# Patient Record
Sex: Female | Born: 1935 | Race: White | Hispanic: No | Marital: Married | Smoking: Never smoker
Health system: Southern US, Community
[De-identification: ages and names within clinical notes are randomized; demographics above are authoritative.]

## PROBLEM LIST (undated history)

## (undated) DIAGNOSIS — K219 Gastro-esophageal reflux disease without esophagitis: Secondary | ICD-10-CM

## (undated) DIAGNOSIS — I10 Essential (primary) hypertension: Secondary | ICD-10-CM

## (undated) DIAGNOSIS — M1990 Unspecified osteoarthritis, unspecified site: Secondary | ICD-10-CM

## (undated) DIAGNOSIS — N289 Disorder of kidney and ureter, unspecified: Secondary | ICD-10-CM

## (undated) DIAGNOSIS — M109 Gout, unspecified: Secondary | ICD-10-CM

## (undated) DIAGNOSIS — G629 Polyneuropathy, unspecified: Secondary | ICD-10-CM

## (undated) DIAGNOSIS — C801 Malignant (primary) neoplasm, unspecified: Secondary | ICD-10-CM

## (undated) DIAGNOSIS — Z8739 Personal history of other diseases of the musculoskeletal system and connective tissue: Secondary | ICD-10-CM

## (undated) DIAGNOSIS — M549 Dorsalgia, unspecified: Secondary | ICD-10-CM

## (undated) DIAGNOSIS — J189 Pneumonia, unspecified organism: Secondary | ICD-10-CM

## (undated) HISTORY — PX: TONSILLECTOMY: SUR1361

## (undated) HISTORY — PX: TOTAL HIP ARTHROPLASTY: SHX124

## (undated) HISTORY — PX: CATARACT EXTRACTION, BILATERAL: SHX1313

## (undated) HISTORY — PX: OTHER SURGICAL HISTORY: SHX169

## (undated) HISTORY — PX: ABDOMINAL HYSTERECTOMY: SHX81

## (undated) HISTORY — PX: MOHS SURGERY: SUR867

---

## 2007-02-10 ENCOUNTER — Emergency Department (HOSPITAL_COMMUNITY): Admission: EM | Admit: 2007-02-10 | Discharge: 2007-02-10 | Payer: Self-pay | Admitting: Emergency Medicine

## 2007-02-10 ENCOUNTER — Inpatient Hospital Stay (HOSPITAL_COMMUNITY): Admission: RE | Admit: 2007-02-10 | Discharge: 2007-02-18 | Payer: Self-pay | Admitting: Orthopaedic Surgery

## 2007-02-24 ENCOUNTER — Inpatient Hospital Stay (HOSPITAL_COMMUNITY): Admission: AD | Admit: 2007-02-24 | Discharge: 2007-02-25 | Payer: Self-pay | Admitting: Emergency Medicine

## 2008-10-03 ENCOUNTER — Encounter: Admission: RE | Admit: 2008-10-03 | Discharge: 2008-10-03 | Payer: Self-pay | Admitting: Family Medicine

## 2009-04-19 IMAGING — CR DG CHEST 2V
2 series · 2 of 2 positions shown · non-contrast
Comparison: None.

CLINICAL DATA: 79-year-old with osteoarthritis of left hip. Preop for surgery.
 CHEST ? 2 VIEW:

[view not recorded (1 of 2)]
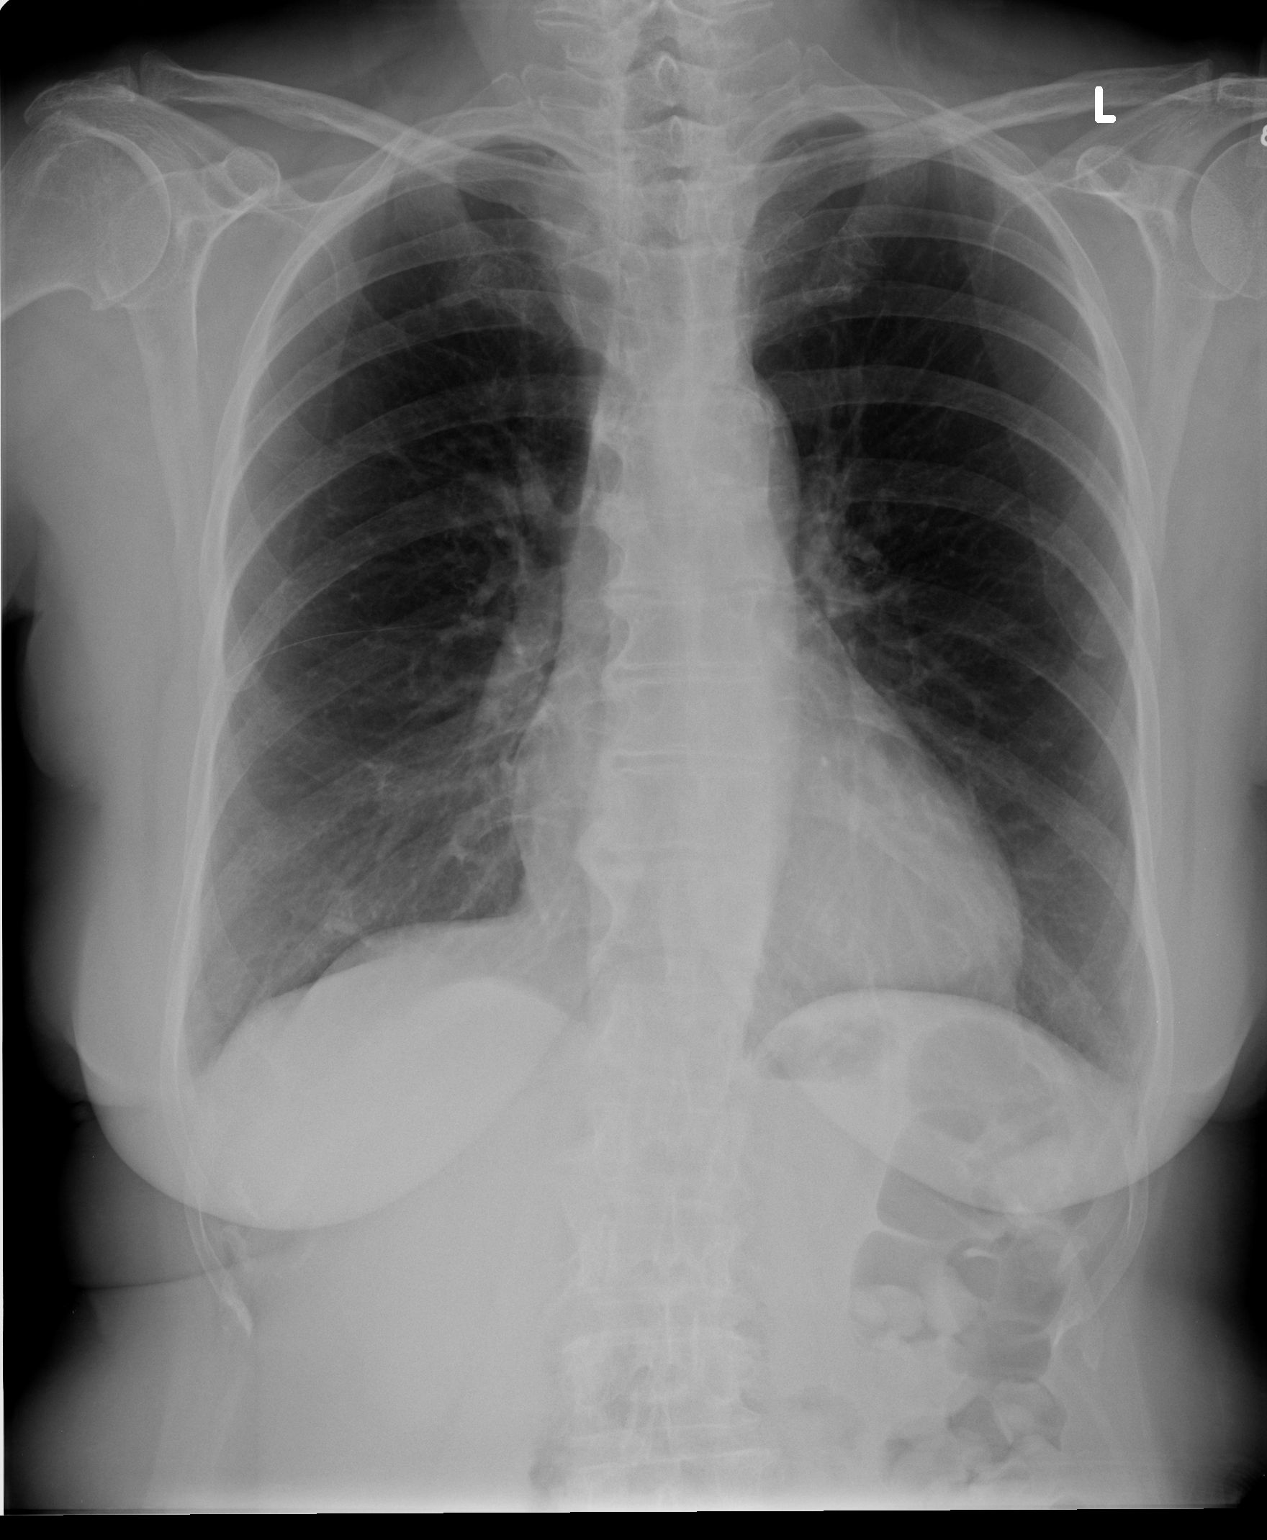

[view not recorded (2 of 2)]
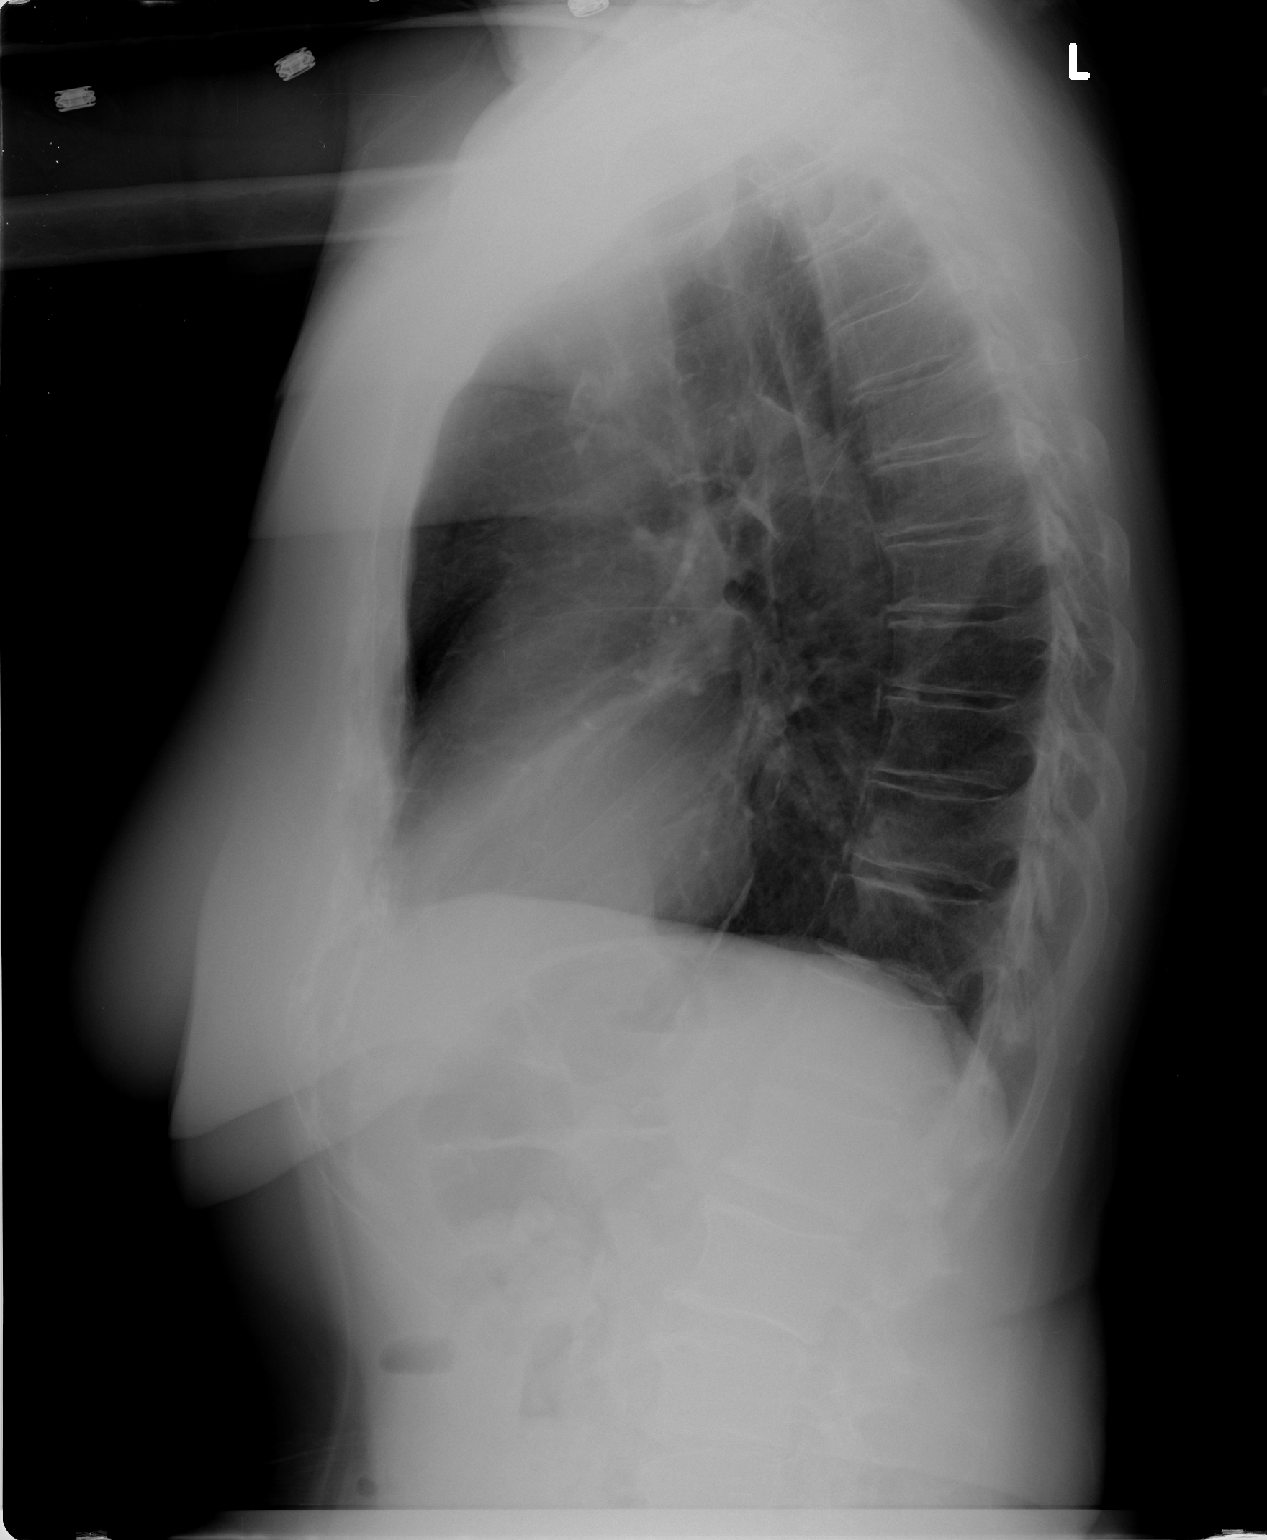

[2 of 2 positions shown; findings below may reference images not displayed]

FINDINGS: Cardiac silhouette, mediastinal and hilar contours are within normal limits.  Mild tortuosity and calcification of the thoracic aorta is noted.  The lungs are clear of acute process.  Bony structures are intact.
IMPRESSION: No acute cardiopulmonary findings.
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
## 2010-06-03 NOTE — Op Note (Signed)
[name], MANTON               ACCOUNT NO.:  [id number]   MEDICAL RECORD NO.:  [id number]          PATIENT TYPE:  INP   LOCATION:  5015                         FACILITY:  [facility]   PHYSICIAN:  Claude Manges. Whitfield, M.D.DATE OF BIRTH:  [birth year]   DATE OF PROCEDURE:  DATE OF DISCHARGE:                               OPERATIVE REPORT   PREOPERATIVE DIAGNOSIS:  Posterior superior dislocation left total hip  replacement.   POSTOPERATIVE DIAGNOSIS:  Posterior superior dislocation left total hip  replacement.   PROCEDURE:  Closed reduction of dislocated left total hip replacement  and application of abduction splint.   SURGEON:  Claude Manges. Cleophas Dunker, M.D.   ASSISTANTDyke Brackett, M.D.   ANESTHESIA:  General orotracheal.   COMPLICATIONS:  None.   PROCEDURE IN DETAIL:  With the patient under general orotracheal  anesthesia she was carefully placed on the operating room table.  Image  intensification was obtained revealing a posterior position of the  bipolar prosthesis.   With the hip in flexion and with traction I was able to reduce the hip.  Preoperatively she was shortened internally rotated.  At that point she  was out to length without malrotation.  She had good pulses.  I then  carefully manipulated her hip and in flexion she was perfectly stable  but with flexion and adduction she was unstable.  Accordingly the  abduction brace was applied and again we checked with image  intensification and she was perfectly stable.  The patient was then  awakened and placed on her bed and returned to the post anesthesia  recovery in satisfactory condition.      Claude Manges. Cleophas Dunker, M.D.  Electronically Signed     PWW/MEDQ  D:  02/11/2007  T:  02/12/2007  Job:  [id number]

## 2010-06-03 NOTE — Discharge Summary (Signed)
[name], [name]               ACCOUNT NO.:  [id number]   MEDICAL RECORD NO.:  [id number]          PATIENT TYPE:  INP   LOCATION:  5015                         FACILITY:  [facility]   PHYSICIAN:  [name], M.D.DATE OF BIRTH:  January 04, 1928   DATE OF ADMISSION:  02/10/2007  DATE OF DISCHARGE:  02/18/2007                               DISCHARGE SUMMARY   ADMISSION DIAGNOSES:  1. End-stage osteoarthritis, left hip.  2. Hypertension.  3. Hyperlipidemia.  4. History of skin cancer.   DISCHARGE DIAGNOSES:  1. End-stage osteoarthritis, left hip, status post left total hip      arthroplasty.  2. Acute dislocation of left total hip after surgery, now status post      closed reduction.  3. Acute blood loss anemia secondary to surgery.  4. Hypotension.  5. Hypokalemia, now resolved.  6. Anxiety/depression.  7. Constipation.  8. Delirium, now resolved.  9. Hypertension.  10.Hyperlipidemia.  11.History of skin cancer.   SURGICAL PROCEDURE:  1. She had a left total hip arthroplasty by Dr. Claude Manges. [name],      assisted by [name] and [name], P.A.-C.  She had a      DePuy Prodigy hip stem with pore coat size 13.5 placed with a      Pinnacle 100 series acetabular cup size 52 and an acetabular cup      size 54 which were not implanted, the same with an Apex hole      eliminator.  She eventually had a self-centering bipolar head, 28      mm inner diameter, 56 mm outer diameter, and an Articul/EZE femoral      head, 28 mm +8.5 neck length, 12/14 comb.  Complications:  Total hip was unable to be placed, and they had to  convert to a bipolar hemiarthroplasty.  1. Closed reduction of her left hip hemiarthroplasty by Dr. Claude Manges.      Knight, assisted by Dr. Lacretia Knight. [name].  Complications:      None.   CONSULTATIONS:  1. Internal medicine consult by [facility] on February 10, 2007.  2. Pharmacy consult for Coumadin therapy on February 10, 2007.  3. Ortho tech  consult for an abduction brace on February 11, 2007.  4. Physical therapy consult on February 12, 2007.  5. Psychiatry consult by Dr. Jeanie Knight on February 14, 2007.  6. Social work consult on February 14, 2007.   HISTORY OF PRESENT ILLNESS:  [name]  presented to Dr. Cleophas Knight with a several month history of left hip  pain.  It has been getting progressively worse.  She lives in [place]  but did not desire surgery in that area.  She saw Dr. Cleophas Knight because  the pain was constant and requiring some pain meds.  X-ray showed a  malformed left femoral head, and because of this, she is presenting for  a left hip arthroplasty.   HOSPITAL COURSE:  Ms. [name] tolerated her surgical procedure well  without immediate postoperative complications.  She was transferred to  5000.  She did have an episode of hypotension.  Fluid challenge was  given, and a medical consult was obtained.  They followed her throughout  her hospitalization.   On postop day #1, T max was 97.7, vitals were stable.  Hemoglobin 9.7,  hematocrit 27.9.  Hypotension had improved.  She was having a fair  amount of anxiety.  She was started on OxyContin for pain, and medicine  started her on some Lexapro.  She had an episode of severe pain with  thrashing about, and new x-rays were ordered of the hip which showed  that the hip was dislocated superiorly.  Subsequently that evening, she  underwent a closed reduction of the hip by Dr. Cleophas Knight.  She tolerated  that well.   Next day, she still had quite a bit of anxiety.  Potassium was low at  3.1, hemoglobin 10.6.  Leg was neurovascularly intact.  She was in the  abduction brace at that time and is to be in that at all times.  Psychiatry was again recommended.  She continued to make slow progress  over the next several days, afebrile, vitals stable.  Leg lengths were  normal length.  She had great difficulty with pain and anxiety about  being moved.   Dr. Jeanie Knight was able to see her on February 14, 2007 and  made some recommendations.   Over the next several days, she made slow progress with therapy.  It was  felt she would require some extra time for rehab, so bed placement for a  skilled facility was started.  Her vitals remained stable.  Hemoglobin/hematocrit normalized, and she was continued on therapy.  At  this time, on February 18, 2007, it is felt she is ready for transfer to  the skilled facility, and she will be transferred there later today.   DIET:  She is to resume her regular hospitalization diet.   DISCHARGE MEDICATIONS:  1. Colace 100 mg p.o. b.i.d.  2. Coumadin 3 mg p.o. at 6 p.m. to keep her INR at about 2.  3. Hydrochlorothiazide 12.5 mg p.o. q.a.m.  4. K-Dur 20 mEq p.o. q.a.m.  5. Lopressor 50 mg p.o. b.i.d.  6. Lotensin 20 mg p.o. q.a.m.  7. Protonix 40 mg p.o. q.a.m.  8. Ambien 5 mg p.o. q.h.s. p.r.n. insomnia.  9. Ativan 1 mg IV q.6h. p.r.n. anxiety.  10.Enema of choice, laxative of choice p.r.n. constipation.  11.Robaxin 500 mg 1-2 tablets p.o. q.6h. p.r.n. for spasms.  12.Tylenol 1-2 tablets p.o. q.4h. p.r.n. temperature or pain.  13.Norco 5/325 1-2 tablets p.o. q.4h. p.r.n. for pain.   ACTIVITY:  She is to be out of bed and partial weightbearing 50% on the  left leg with the use of walker.  She is to have the abduction brace on  at all times.  She is to have PT and OT per rehab protocol.   WOUND CARE:  Left hip incision is to be kept clean and dry.  If Mepilex  is in place, it can remain in place for about 3-5 days and then can be  changed as needed.  Wound can be cleaned with Betadine and apply a dry  dressing.  Please notify Dr. Cleophas Knight of temperature greater than  101.5, chills, pain unrelieved by pain medications, or foul-smelling  drainage from the wound.   FOLLOW UP:  She needs to follow up with Dr. Cleophas Knight in our office next  week and needs to call [phone] for that appointment.  She  needs to  follow up with a psychologist in  the next couple of weeks.  He  recommends if the facility has one that visits, they can follow up with  her for her anxiety.  Otherwise, she can follow up as an outpatient with  Dr. Farrel Demark or Dr. Dellia Cloud of .   LABORATORY DATA:  Hemoglobin and hematocrit have ranged from 9.3 and  27.1 on the 24th to 10.2 and 28.8 on the 30th.  White count went from  9.5 on the 24th to 11.4 on the 25th, to 8.6 on the 30th.  Platelet count  has been within normal limits.   Potassium dropped to a low of 3.1 on the 25th.  Glucose ranged from 105  on the 30th to 154 on the 25th.  PT and INR have gone from 18.7 and 1.5  on the 24th to 29.5 and 2.7 on the 30th.  Calcium dropped to a low of  8.2 on the 25th.  All other laboratory studies are within normal limits.   Chest x-ray done on February 04, 2007 showed no acute cardiopulmonary  findings.  X-ray taken of the left hip on February 11, 2007 showed a  dislocation of that left hip hemiarthroplasty.      Legrand Pitts Duffy, P.A.      Sherry Manges. [name], M.D.  Electronically Signed    KED/MEDQ  D:  02/18/2007  T:  02/18/2007  Job:  [id number]   cc:   Antonietta Breach, M.D.

## 2010-06-03 NOTE — Op Note (Signed)
[name], [name]               ACCOUNT NO.:  [id number]   MEDICAL RECORD NO.:  [id number]          PATIENT TYPE:  EMS   LOCATION:  MAJO                         FACILITY:  [facility]   PHYSICIAN:  Claude Manges. 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   PROCEDURE:  Left total hip replacement (bipolar arthroplasty).   SURGEON:  Claude Manges. Cleophas Dunker, M.D.   ASSISTANT:  Thereasa Distance A. Chaney Malling, M.D. and Arlys John D. Petrarca, P.A.-C.   ANESTHESIA:  General.   COMPLICATIONS:  None.   COMPONENTS:  A 13.5 mm small stature Prodigy femoral stem, 56 mm outer  diameter femoral head with bipolar construct (failed attempt at total  hip replacement with unstable components using a 52 and 54 acetabular  components).   PROCEDURE:  The patient comfortable on operating table and under general  orotracheal anesthesia, nursing staff inserted a Foley catheter.  The  patient was then placed in the lateral decubitus position and carefully  padded.   The left hip and lower extremity were then prepped with Betadine scrub  and DuraPrep from iliac crest to the ankle.  Sterile draping was  performed.   A routine Southern incision was utilized and via sharp dissection  carried down to subcutaneous tissue.  Small bleeders were Bovie  coagulated.  Dissection was carried down through adipose tissue to the  level of the iliotibial band.  Self-retaining retractors were inserted.  Iliotibial band was identified, incised along with the skin incision.  We had some difficulty with internal rotation of the hip because of the  deformity but I was able locate the short external rotators.  These were  tagged with 0-0 Ethibond suture  and then incised in their attachment of  the posterior aspect of the greater trochanter.  The capsule was  identified and carefully incised.  The head was then dislocated easily  posteriorly.  The retractor was then carefully placed about the femoral  neck and head using the calcar guide, an osteotomy was made about a  fingerbreadth proximal to the lesser trochanter.  The head and neck were  then removed from the wound.  The head was malformed.  There appeared to  be areas of avascular necrosis.  There was considerable synovitis within  the joint.  Synovectomy was performed.   We easily prepared the femur for prosthetic insertion.  Starter hole was  then made.  Reaming was performed to 13 to accept a 13.5-mm component.  Rasping was performed sequentially to 13.5 small stature we had a very  nice position on the calcar.  There were no cracks.   Retractor was then placed about the acetabulum.  Further synovectomy and  soft tissue was removed from the periphery including any extraneous  labral material.  There were considerable spurring.  We had measured a  52 mm outer diameter femoral  head preoperatively by templating we reamed  to a 51 and trialed a 52 and felt that we had a very nice rim fit and we  would seat completely.   The 52 mm outer diameter 100 series acetabular component was then  inserted.  Despite impaction we could not make it stable as the patient  appeared to have an expanding acetabulum.  We then trialed a 54 mm outer  diameter and reamed to 53.  We trialed a 54 mm outer diameter 100 series  component and the acetabulum continued to feel as if it were expanding.  We could not get good purchase.  We did identify the sciatic nerve.  We  carefully protected it throughout the operative procedure.  We did have  a nerve stimulator to be sure the nerve was functioning.   At that point we felt that we needed stability and accordingly, we  decided on a bipolar component.  We  used a 56 mm head as we again  trialed with a 56 and felt that it would completely seat within the  acetabulum.  The posterior wall was devoid of some bony material and  that perhaps was part of the problem.  Nonetheless, with the bipolar  component, we had nice stability without posterior dislocation using the  prodigy neck.   We then elected to proceed with the final bipolar components.  The wound  was copiously irrigated with saline solution after removing all trial  components.   The prodigy 13.5 mm femoral component was then impacted flush on the  calcar.  The acetabulum was carefully irrigated.  Any soft tissue was  carefully protected with my finger.  We constructed the bipolar  components with a 56 mm outer diameter head.  We then reduced it and  through a range of motion we could not dislocate the hip posteriorly.   The wound was again irrigated with saline solution.  Again we checked  the nerve stimulator.  The nerve was functioning   I then carefully and anatomically closed the capsule with 0-0 Ethibond  suture.  The tensor fascia lata was then closed with a running 0-0  Vicryl along with the iliotibial band.  The subcu was closed in several  layers of 0-0 and 2-0 Vicryl, skin closed with skin clips.  Sterile  bulky dressing was applied followed by a knee immobilizer.  The patient  was then awoken and returned to the post anesthesia recovery in  satisfactory condition.      Claude Manges. Cleophas Dunker, M.D.  Electronically Signed     PWW/MEDQ  D:  02/10/2007  T:  02/10/2007  Job:  [id number]

## 2010-06-03 NOTE — Op Note (Signed)
NAMEMAGDELENA, KINSELLA               ACCOUNT NO.:  [id number]   MEDICAL RECORD NO.:  [id number]          PATIENT TYPE:  INP   LOCATION:  5038                         FACILITY:  [facility]   PHYSICIAN:  Claude Manges. Whitfield, M.D.DATE OF BIRTH:  25-Dec-1927   DATE OF PROCEDURE:  02/24/2007  DATE OF DISCHARGE:  02/25/2007                               OPERATIVE REPORT   PREOPERATIVE DIAGNOSIS:  Posterior superior dislocation of bipolar  arthroplasty left hip.   POSTOPERATIVE DIAGNOSIS:  Posterior superior dislocation of bipolar  arthroplasty left hip.   PROCEDURE:  Attempted closed reduction.   SURGEON:  Claude Manges. Cleophas Dunker, M.D.   ASSISTANT:  Arlys John D. Petrarca, P.A.-C.   ANESTHESIA:  General orotracheal.   COMPLICATIONS:  Unable to reduce.   PROCEDURE IN DETAIL:  With the patient comfortable in the operating room  on her floor bed she was placed under general anesthesia and then  transferred carefully to the operating room table in her abduction  splint.  The splint was released.  Skin was inspected and her old  incision had healed very nicely.  There was no evidence of any skin  problems other than the fact that it was dry.  Urine had soaked the  brace.   Images revealed a posterior superior position of the bipolar head.  We  attempted in multiple positions with traction to reduce the head and  were unsuccessful.  She was considerably short and abducted.  We were  able to at least bring about out to length and abduct her but could not  place the head in the acetabulum.  We tried flexion and even  hyperflexion and then traction, adduction, abduction and even  longitudinal traction and slight flexion and could not dislodge the  head.  I suspect that the bipolar head is spun and so is perched above  the acetabulum or there is sore tissue i.e. capsule between preventing  complete reduction.   Ms. Lint has an appointment to see Dr. Lamar Sprinkles at [facility] on  Tuesday for consideration  of hip revision and I felt that to open the  hip to reduce the fracture would just potentially compromise future  surgery, increase the risk of infection.  I think we can make her  comfortable in the abduction brace.  We changed the padding and padded  the incision very well and the leg lengths at that point were about  level so we did provide some distal position.  We even fluoro'd the hip  and felt that we just could not quite seat the acetabulum.  The patient  tolerated the procedure without complications and she will be returned  to the post anesthesia recovery room in satisfactory position.     Claude Manges. Cleophas Dunker, M.D.  Electronically Signed    PWW/MEDQ  D:  02/24/2007  T:  02/25/2007  Job:  [id number]

## 2010-06-03 NOTE — Discharge Summary (Signed)
NAMEGLADY, Knight               ACCOUNT NO.:  [id number]   MEDICAL RECORD NO.:  [id number]          PATIENT TYPE:  EMS   LOCATION:  MAJO                         FACILITY:  [facility]   PHYSICIAN:  Claude Manges. Whitfield, M.D.DATE OF BIRTH:  [birth year]   DATE OF ADMISSION:  02/10/2007  DATE OF DISCHARGE:  02/18/2007                               DISCHARGE SUMMARY   ADMISSION DIAGNOSES:  1. End-stage osteoarthritis left hip.  2. Hypertension.  3. Hyperlipidemia.  4. History of skin cancer.   DISCHARGE DIAGNOSES:  1. End-stage osteoarthritis left hip status post left total hip      arthroplasty.  2. Acute dislocation left total hip now status post closed reduction.  3. Anxiety/depression.  4. Acute blood loss anemia secondary to surgery.  5. Transient hypotension now resolved.  6. Acute blood loss anemia secondary to surgery.  7. Hypokalemia now resolved.   1. Hypertension.  2. Hyperlipidemia.  3. History of skin cancer.   SURGICAL PROCEDURES:  1. On February 10, 2007, [name] underwent a left total hip      arthroplasty by Dr. Claude Manges.  Whitfield assisted by Dr. Rinaldo Ratel and Jennette Kettle PA-C.  She had a bipolar hip      hemiarthroplasty placed.  It was an apex hole eliminator and      pedicle 100 series acetabular cup size 52.  A pinnacle sector 2      acetabular cup size 54 was not implanted, but a self centering      bipolar head 28 mm inner diameter 56 mm outer diameter.  A Prodigy      hip stem with Porocoat 32.5 mm stem left with an Articul/EZE      femoral head +8.5 neck length.   1. On February 11, 2007 [name] underwent a closed reduction of her      left hip hemiarthroplasty by Dr. Claude Manges.  Whitfield assisted by      Dr. Lacretia Nicks.  Frederico Hamman.   COMPLICATIONS:  None.   CONSULTANT:  1. Internal Medicine consult by [facility] February 10, 2007.  2. Pharmacy consult for Coumadin therapy February 10, 2007.  3. Physical therapy consult February 11, 2007.  4. [employer] consult for abduction brace.  5. Psychiatry consult February 14, 2007.  6. Case management consult February 14, 2007   HISTORY OF PRESENT ILLNESS:  [name]  presented to Dr. Cleophas Dunker with a several months history of left hip  pain.  Pain has been getting progressively worse and she was diagnosed  with some arthritis.  She did not treatment in [place], [place], so  came down to see Dr. Cleophas Dunker.  Pain is severe enough it is requiring  medications.  X-rays show a malformed left hip, and because of this, she  is presenting for left hip replacement.   HOSPITAL COURSE:  [name] tolerated her surgical procedure well and  she was moved to the recovery room.  She did have some problems with  hypotension postoperatively and was having a fair amount of pain.  A  medical consult was obtained for hypotension and they followed her  throughout her hospitalization.  She had difficulty with pain and  thrashed about when awakening and with those types of movements was  noted to have dislocated her total hip.  [employer] was consulted for an  abduction brace and plans were made for a closed reduction of the hip  the next day.  She was afebrile, vitals stable.  Hemoglobin 9.7,  hematocrit 27.9.  On the evening of January  23, she underwent closed  reduction of the dislocated hip hemiarthroplasty and she tolerated that  well.   On January  24, her hypotension improved but her anxiety was escalating  due to the events from the day before.  She was having a fair amount of  pain was minimal contact.  Potassium was low at 3.1, hemoglobin 10.6.  She was started on therapy per protocol.  Anxiety was treated with  medications at that time.   On January 26, T-max 98.6, vitals stable.  Hemoglobin 10.1, hematocrit  28.8.  She had some mild swelling of the thigh.  Leg lengths seemed to  be equal.  She was to be in the abduction brace at all times.  Psych  consult was  obtained, and they followed her and made recommendations  over the next several days.  It was felt at that time she would require  little additional therapy before going home, so plans were made for  skilled placement.   She continued to make slow progress over the next several days.  She  remained basically afebrile, vitals stable.  INR got up to a therapeutic  level hemoglobin/hematocrit stabilized.  She did not require any more  blood.  Had some difficulty with constipation which was treated with  laxatives.  Her anxiety did improve with medications.  She is voiding  without difficulty, and it is felt she is ready for transfer to the  skilled facility today and will be discharged later today.   MEDICATIONS:  1. Coumadin p.o. q. 6 p.m., dose to be 3 mg p.o. daily to keep her INR      between right about two.  2. Colace 100 mg p.o. b.i.d..  3. Hydrochlorothiazide 12.5 mg p.o. q.a.m.Marland Kitchen  4. Lotensin 20 mg p.o. q.a.m..  5. K-Dur 20 mEq p.o. q.a.m..  6. Protonix 40 mg p.o. q.a.m.Marland Kitchen  7. Metoprolol 50 mg p.o. b.i.d..  8. Ambien 5 mg p.o. q.h.s. p.r.n. insomnia.  9. Laxative of choice p.r.n. constipation.  10.Robaxin 500 mg 1-2 tablets p.o. q.6 h p.r.n. for spasms.  11.Ativan 1 mg IV q.6 h p.r.n. anxiety.  12.Hydrocodone 5/325 1-2 tablets p.o. q.4 h p.r.n. for pain.   DISCHARGE INSTRUCTIONS:  1. Diet:  She is to resume her regular diet.  2. Activity:  She is to be out of bed partial weightbearing 50% or      less on the left leg with the use of a walker.  She is to wear the      abduction brace at all times.  She is to have PT and OT per rehab      protocol.  3. Wound care:  Please clean the left hip incision with Betadine daily      and apply dry dressing.  If she comes to the skilled facility with      a Mepilex in place, that can remain as long as it stays intact for      another 3-5 days.  At that  point, the wound probably can be open to      air.  Please notify Dr. Cleophas Dunker of  temperature greater than      101.5, chills, pain unrelieved by pain medication or foul-smelling      drainage from the wound.  4. Follow-up:  She needs to follow up with Dr. Cleophas Dunker in our office      in approximately one week and needs to call [phone].  She does      need to have some psychotherapy to treat her anxiety and needs a      psychologist, either one for the skilled facility or Dr. Jeanie Sewer      here recommended Dr. Farrel Demark or Dr. Latrelle Dodrill with [facility].  She needs      to follow up with him per the facility.   LABORATORY DATA:  Hemoglobin/hematocrit ranged from 11.4 and 32.7 on  January 22 to 10.1 and 28.8 on January 26.  White count remained within  normal limits.  Platelet count went to a high of 419 on the 30th.   Potassium dropped to a low of 3.1 on January 25 then was within normal  limits.  Glucose ranged from 105 on January 30 to 154 on January 25.  PT  and INR ranged from 18.7 and 1.5 on January 24 to 2.8 and 30.2 on  January  29 to 29.5 and 2.7 on January  30.  Calcium dropped to a low of  8.2 on January 25.  All other laboratory studies were within normal  limits.      Legrand Pitts Duffy, P.A.      Claude Manges. Cleophas Dunker, M.D.  Electronically Signed    KED/MEDQ  D:  02/18/2007  T:  02/18/2007  Job:  [id number]
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
## 2010-10-09 LAB — PROTIME-INR
INR: 1.7 — ABNORMAL HIGH
INR: 2.8 — ABNORMAL HIGH
Prothrombin Time: 14.1
Prothrombin Time: 18.7 — ABNORMAL HIGH
Prothrombin Time: 20.8 — ABNORMAL HIGH

## 2010-10-09 LAB — COMPREHENSIVE METABOLIC PANEL
ALT: 16
ALT: 19
AST: 18
AST: 20
Albumin: 3.8
Albumin: 3.9
Alkaline Phosphatase: 129 — ABNORMAL HIGH
Alkaline Phosphatase: 157 — ABNORMAL HIGH
BUN: 15
CO2: 28
Chloride: 100
Creatinine, Ser: 0.86
GFR calc non Af Amer: >60
Potassium: 3.4 — ABNORMAL LOW
Potassium: 4
Sodium: 135
Sodium: 136
Total Bilirubin: 0.5
Total Protein: 6.7

## 2010-10-09 LAB — BASIC METABOLIC PANEL
BUN: 11
BUN: 14
BUN: 8
BUN: 9
CO2: 24
CO2: 25
CO2: 25
CO2: 26
Calcium: 8.2 — ABNORMAL LOW
Calcium: 8.2 — ABNORMAL LOW
Calcium: 8.2 — ABNORMAL LOW
Chloride: 103
Chloride: 106
Chloride: 98
Creatinine, Ser: 0.62
Creatinine, Ser: 0.69
Creatinine, Ser: 0.83
GFR calc Af Amer: >60
GFR calc Af Amer: >60
GFR calc Af Amer: >60
GFR calc non Af Amer: >60
GFR calc non Af Amer: >60
Glucose, Bld: 140 — ABNORMAL HIGH
Glucose, Bld: 154 — ABNORMAL HIGH
Potassium: 3.1 — ABNORMAL LOW
Potassium: 4
Potassium: 4.1
Sodium: 135
Sodium: 136

## 2010-10-09 LAB — CBC
HCT: 27.9 — ABNORMAL LOW
HCT: 30.9 — ABNORMAL LOW
HCT: 31.3 — ABNORMAL LOW
Hemoglobin: 10.2 — ABNORMAL LOW
Hemoglobin: 10.9 — ABNORMAL LOW
MCHC: 34.1
MCHC: 34.5
MCHC: 34.9
MCHC: 35
MCHC: 35.1
MCHC: 35.4
MCV: 91.7
MCV: 92.3
MCV: 92.3
MCV: 92.5
MCV: 93.1
Platelets: 214
Platelets: 233
Platelets: 247
Platelets: 254
Platelets: 302
Platelets: 321
Platelets: 349
RBC: 2.69 — ABNORMAL LOW
RBC: 3.14 — ABNORMAL LOW
RBC: 3.15 — ABNORMAL LOW
RBC: 3.38 — ABNORMAL LOW
RDW: 11.8
RDW: 12.1
RDW: 12.7
RDW: 12.7
RDW: 12.8
RDW: 12.9
WBC: 15.4 — ABNORMAL HIGH
WBC: 7.8
WBC: 8
WBC: 8.6

## 2010-10-09 LAB — URINE MICROSCOPIC-ADD ON

## 2010-10-09 LAB — CROSSMATCH
ABO/RH(D): O POS
Antibody Screen: NEGATIVE

## 2010-10-09 LAB — URINALYSIS, ROUTINE W REFLEX MICROSCOPIC
Bilirubin Urine: NEGATIVE
Glucose, UA: NEGATIVE
Glucose, UA: NEGATIVE
Leukocytes, UA: NEGATIVE
Nitrite: NEGATIVE
Nitrite: NEGATIVE
Specific Gravity, Urine: 1.006
Specific Gravity, Urine: 1.008
pH: 6.5
pH: 7

## 2010-10-09 LAB — CARDIAC PANEL(CRET KIN+CKTOT+MB+TROPI)
CK, MB: 2.9
CK, MB: 4.7 — ABNORMAL HIGH
Relative Index: 1.4
Total CK: 318 — ABNORMAL HIGH
Troponin I: 0.02

## 2010-10-09 LAB — DIFFERENTIAL
Basophils Absolute: 0
Basophils Relative: 1
Eosinophils Absolute: 0.3
Eosinophils Relative: 3
Lymphs Abs: 1.6
Monocytes Absolute: 0.8
Monocytes Relative: 10
Monocytes Relative: 9
Neutro Abs: 4.9
Neutrophils Relative %: 70

## 2010-10-09 LAB — PREPARE RBC (CROSSMATCH)

## 2010-10-09 LAB — ABO/RH: ABO/RH(D): O POS

## 2010-10-09 LAB — APTT: aPTT: 29

## 2010-10-09 LAB — URINE CULTURE

## 2010-10-10 LAB — DIFFERENTIAL
Basophils Relative: 1
Eosinophils Absolute: 0.2
Monocytes Relative: 10
Neutro Abs: 5.7
Neutrophils Relative %: 68

## 2010-10-10 LAB — CBC
HCT: 29.8 — ABNORMAL LOW
Hemoglobin: 10.2 — ABNORMAL LOW
MCHC: 34.2
RBC: 3.21 — ABNORMAL LOW
RDW: 13.1

## 2010-10-10 LAB — COMPREHENSIVE METABOLIC PANEL
ALT: 19
Alkaline Phosphatase: 128 — ABNORMAL HIGH
BUN: 27 — ABNORMAL HIGH
CO2: 26
Calcium: 8.8
GFR calc non Af Amer: >60
Glucose, Bld: 108 — ABNORMAL HIGH
Potassium: 4.3
Total Protein: 5.6 — ABNORMAL LOW

## 2010-10-10 LAB — PROTIME-INR
INR: 2.1 — ABNORMAL HIGH
Prothrombin Time: 24.6 — ABNORMAL HIGH

## 2010-10-10 LAB — APTT: aPTT: 45 — ABNORMAL HIGH

## 2010-12-17 ENCOUNTER — Other Ambulatory Visit: Payer: Self-pay | Admitting: Family Medicine

## 2010-12-17 ENCOUNTER — Other Ambulatory Visit: Payer: Self-pay | Admitting: Gastroenterology

## 2010-12-17 DIAGNOSIS — Z1231 Encounter for screening mammogram for malignant neoplasm of breast: Secondary | ICD-10-CM

## 2010-12-30 ENCOUNTER — Ambulatory Visit
Admission: RE | Admit: 2010-12-30 | Discharge: 2010-12-30 | Disposition: A | Discharge status: Home / Self Care | Payer: Medicare Other | Source: Ambulatory Visit | Attending: Family Medicine | Admitting: Family Medicine

## 2010-12-30 DIAGNOSIS — Z1231 Encounter for screening mammogram for malignant neoplasm of breast: Secondary | ICD-10-CM

## 2013-09-27 ENCOUNTER — Other Ambulatory Visit: Payer: Self-pay

## 2013-09-27 DIAGNOSIS — Z1231 Encounter for screening mammogram for malignant neoplasm of breast: Secondary | ICD-10-CM

## 2013-10-09 ENCOUNTER — Ambulatory Visit
Admission: RE | Admit: 2013-10-09 | Discharge: 2013-10-09 | Disposition: A | Discharge status: Home / Self Care | Payer: Medicare Other | Source: Ambulatory Visit

## 2013-10-09 DIAGNOSIS — Z1231 Encounter for screening mammogram for malignant neoplasm of breast: Secondary | ICD-10-CM

## 2016-02-24 ENCOUNTER — Telehealth: Payer: Self-pay | Admitting: Internal Medicine

## 2016-02-24 NOTE — Telephone Encounter (Signed)
Called by [facility] on possible transfer:  Patient is 83 yo F with H/o HTN, gout, GERD.  Hip pain put on NSAIDS recently.  New AMS today.  4 episodes of black vomiting.  No active vomiting in ED.  HGB 9.3, WBC 16k.  BUN 77. Creat 2.17.  BP 128/85.  They are repeating BMP to see where kidney function has gone with IVF (she has been in their ED for 8 hours).  If improved then will accept to SDU here at [facility].  Otherwise she really needs to go [facility], but [facility] has no [facility].  Anticipate call back with update.

## 2016-02-25 ENCOUNTER — Encounter (HOSPITAL_COMMUNITY): Payer: Self-pay | Admitting: Internal Medicine

## 2016-02-25 ENCOUNTER — Inpatient Hospital Stay (HOSPITAL_COMMUNITY)
Admission: EM | Admit: 2016-02-25 | Discharge: 2016-02-28 | DRG: 377 | Disposition: A | Discharge status: Home Health | Payer: Medicare Other | Source: Other Acute Inpatient Hospital | Attending: Internal Medicine | Admitting: Internal Medicine

## 2016-02-25 ENCOUNTER — Encounter (HOSPITAL_COMMUNITY)
Admission: EM | Disposition: A | Discharge status: Home Health | Payer: Self-pay | Source: Other Acute Inpatient Hospital | Attending: Internal Medicine

## 2016-02-25 DIAGNOSIS — B961 Klebsiella pneumoniae [K. pneumoniae] as the cause of diseases classified elsewhere: Secondary | ICD-10-CM | POA: Diagnosis present

## 2016-02-25 DIAGNOSIS — G629 Polyneuropathy, unspecified: Secondary | ICD-10-CM | POA: Diagnosis present

## 2016-02-25 DIAGNOSIS — E876 Hypokalemia: Secondary | ICD-10-CM | POA: Diagnosis present

## 2016-02-25 DIAGNOSIS — Z886 Allergy status to analgesic agent status: Secondary | ICD-10-CM

## 2016-02-25 DIAGNOSIS — Z8269 Family history of other diseases of the musculoskeletal system and connective tissue: Secondary | ICD-10-CM | POA: Diagnosis not present

## 2016-02-25 DIAGNOSIS — K210 Gastro-esophageal reflux disease with esophagitis: Secondary | ICD-10-CM | POA: Diagnosis present

## 2016-02-25 DIAGNOSIS — Z79899 Other long term (current) drug therapy: Secondary | ICD-10-CM | POA: Diagnosis not present

## 2016-02-25 DIAGNOSIS — Z833 Family history of diabetes mellitus: Secondary | ICD-10-CM

## 2016-02-25 DIAGNOSIS — W19XXXA Unspecified fall, initial encounter: Secondary | ICD-10-CM

## 2016-02-25 DIAGNOSIS — F09 Unspecified mental disorder due to known physiological condition: Secondary | ICD-10-CM | POA: Diagnosis present

## 2016-02-25 DIAGNOSIS — M109 Gout, unspecified: Secondary | ICD-10-CM | POA: Diagnosis present

## 2016-02-25 DIAGNOSIS — K219 Gastro-esophageal reflux disease without esophagitis: Secondary | ICD-10-CM

## 2016-02-25 DIAGNOSIS — N19 Unspecified kidney failure: Secondary | ICD-10-CM | POA: Diagnosis not present

## 2016-02-25 DIAGNOSIS — M25551 Pain in right hip: Secondary | ICD-10-CM | POA: Diagnosis present

## 2016-02-25 DIAGNOSIS — G9349 Other encephalopathy: Secondary | ICD-10-CM | POA: Diagnosis present

## 2016-02-25 DIAGNOSIS — K922 Gastrointestinal hemorrhage, unspecified: Principal | ICD-10-CM | POA: Diagnosis present

## 2016-02-25 DIAGNOSIS — K2210 Ulcer of esophagus without bleeding: Secondary | ICD-10-CM | POA: Diagnosis present

## 2016-02-25 DIAGNOSIS — K317 Polyp of stomach and duodenum: Secondary | ICD-10-CM | POA: Diagnosis present

## 2016-02-25 DIAGNOSIS — Z96641 Presence of right artificial hip joint: Secondary | ICD-10-CM | POA: Diagnosis present

## 2016-02-25 DIAGNOSIS — W19XXXS Unspecified fall, sequela: Secondary | ICD-10-CM | POA: Diagnosis not present

## 2016-02-25 DIAGNOSIS — I10 Essential (primary) hypertension: Secondary | ICD-10-CM | POA: Diagnosis present

## 2016-02-25 DIAGNOSIS — D62 Acute posthemorrhagic anemia: Secondary | ICD-10-CM | POA: Diagnosis present

## 2016-02-25 DIAGNOSIS — Z888 Allergy status to other drugs, medicaments and biological substances status: Secondary | ICD-10-CM | POA: Diagnosis not present

## 2016-02-25 DIAGNOSIS — Z9841 Cataract extraction status, right eye: Secondary | ICD-10-CM | POA: Diagnosis not present

## 2016-02-25 DIAGNOSIS — Z9842 Cataract extraction status, left eye: Secondary | ICD-10-CM | POA: Diagnosis not present

## 2016-02-25 DIAGNOSIS — N179 Acute kidney failure, unspecified: Secondary | ICD-10-CM | POA: Diagnosis present

## 2016-02-25 DIAGNOSIS — N390 Urinary tract infection, site not specified: Secondary | ICD-10-CM | POA: Diagnosis present

## 2016-02-25 HISTORY — DX: Unspecified osteoarthritis, unspecified site: M19.90

## 2016-02-25 HISTORY — DX: Personal history of other diseases of the musculoskeletal system and connective tissue: Z87.39

## 2016-02-25 HISTORY — DX: Pneumonia, unspecified organism: J18.9

## 2016-02-25 HISTORY — DX: Gastro-esophageal reflux disease without esophagitis: K21.9

## 2016-02-25 HISTORY — DX: Disorder of kidney and ureter, unspecified: N28.9

## 2016-02-25 HISTORY — DX: Dorsalgia, unspecified: M54.9

## 2016-02-25 HISTORY — DX: Essential (primary) hypertension: I10

## 2016-02-25 HISTORY — DX: Gout, unspecified: M10.9

## 2016-02-25 HISTORY — DX: Malignant (primary) neoplasm, unspecified: C80.1

## 2016-02-25 HISTORY — DX: Polyneuropathy, unspecified: G62.9

## 2016-02-25 LAB — CBC
HCT: 20.9 % — ABNORMAL LOW (ref 36.0–46.0)
HEMATOCRIT: 22 % — AB (ref 36.0–46.0)
Hemoglobin: 7.4 g/dL — ABNORMAL LOW (ref 12.0–15.0)
Hemoglobin: 7.1 g/dL — ABNORMAL LOW (ref 12.0–15.0)
MCH: 30.7 pg (ref 26.0–34.0)
MCH: 31.6 pg (ref 26.0–34.0)
MCHC: 33.6 g/dL (ref 30.0–36.0)
MCHC: 34 g/dL (ref 30.0–36.0)
MCV: 91.3 fL (ref 78.0–100.0)
MCV: 92.9 fL (ref 78.0–100.0)
PLATELETS: 219 10*3/uL (ref 150–400)
Platelets: 205 10*3/uL (ref 150–400)
RBC: 2.41 MIL/uL — ABNORMAL LOW (ref 3.87–5.11)
RBC: 2.25 MIL/uL — AB (ref 3.87–5.11)
RDW: 14.4 % (ref 11.5–15.5)
RDW: 14.6 % (ref 11.5–15.5)
WBC: 11 10*3/uL — AB (ref 4.0–10.5)
WBC: 9.8 10*3/uL (ref 4.0–10.5)

## 2016-02-25 LAB — MRSA PCR SCREENING: MRSA BY PCR: POSITIVE — AB

## 2016-02-25 LAB — BASIC METABOLIC PANEL
ANION GAP: 6 (ref 5–15)
BUN: 61 mg/dL — ABNORMAL HIGH (ref 6–20)
CHLORIDE: 117 mmol/L — AB (ref 101–111)
CO2: 19 mmol/L — AB (ref 22–32)
Calcium: 8.3 mg/dL — ABNORMAL LOW (ref 8.9–10.3)
Creatinine, Ser: 1.35 mg/dL — ABNORMAL HIGH (ref 0.44–1.00)
GFR calc Af Amer: 40 mL/min — ABNORMAL LOW (ref >60)
GFR calc non Af Amer: 34 mL/min — ABNORMAL LOW (ref >60)
GLUCOSE: 101 mg/dL — AB (ref 65–99)
POTASSIUM: 3.9 mmol/L (ref 3.5–5.1)
Sodium: 142 mmol/L (ref 135–145)

## 2016-02-25 LAB — URINALYSIS, ROUTINE W REFLEX MICROSCOPIC
Bilirubin Urine: NEGATIVE
GLUCOSE, UA: NEGATIVE mg/dL
Ketones, ur: NEGATIVE mg/dL
Leukocytes, UA: NEGATIVE
NITRITE: NEGATIVE
PH: 5 (ref 5.0–8.0)
Protein, ur: NEGATIVE mg/dL
Specific Gravity, Urine: 1.01 (ref 1.005–1.030)
Squamous Epithelial / LPF: NONE SEEN

## 2016-02-25 LAB — COMPREHENSIVE METABOLIC PANEL
ALBUMIN: 3 g/dL — AB (ref 3.5–5.0)
ALT: 26 U/L (ref 14–54)
ANION GAP: 7 (ref 5–15)
AST: 20 U/L (ref 15–41)
Alkaline Phosphatase: 66 U/L (ref 38–126)
BILIRUBIN TOTAL: 0.5 mg/dL (ref 0.3–1.2)
BUN: 85 mg/dL — ABNORMAL HIGH (ref 6–20)
CHLORIDE: 113 mmol/L — AB (ref 101–111)
CO2: 21 mmol/L — ABNORMAL LOW (ref 22–32)
Calcium: 8.2 mg/dL — ABNORMAL LOW (ref 8.9–10.3)
Creatinine, Ser: 1.59 mg/dL — ABNORMAL HIGH (ref 0.44–1.00)
GFR calc Af Amer: 33 mL/min — ABNORMAL LOW (ref >60)
GFR calc non Af Amer: 28 mL/min — ABNORMAL LOW (ref >60)
GLUCOSE: 112 mg/dL — AB (ref 65–99)
POTASSIUM: 4 mmol/L (ref 3.5–5.1)
Sodium: 141 mmol/L (ref 135–145)
TOTAL PROTEIN: 5.1 g/dL — AB (ref 6.5–8.1)

## 2016-02-25 LAB — PROTIME-INR
INR: 1.13
PROTHROMBIN TIME: 14.6 s (ref 11.4–15.2)

## 2016-02-25 SURGERY — ESOPHAGOGASTRODUODENOSCOPY (EGD) WITH PROPOFOL
Anesthesia: Monitor Anesthesia Care

## 2016-02-25 MED ORDER — SUCRALFATE 1 GM/10ML PO SUSP
1.0000 g | Freq: Three times a day (TID) | ORAL | Status: DC
  Administered 2016-02-25 – 2016-02-28 (×10): 1 g via ORAL
  Filled 2016-02-25 (×11): qty 10

## 2016-02-25 MED ORDER — SODIUM CHLORIDE 0.9 % IV SOLN
INTRAVENOUS | Status: DC
  Administered 2016-02-25 – 2016-02-27 (×6): via INTRAVENOUS
  Administered 2016-02-28: 75 mL/h via INTRAVENOUS

## 2016-02-25 MED ORDER — PREGABALIN 25 MG PO CAPS
25.0000 mg | ORAL_CAPSULE | Freq: Two times a day (BID) | ORAL | Status: DC
  Administered 2016-02-25 – 2016-02-28 (×5): 25 mg via ORAL
  Filled 2016-02-25 (×6): qty 1

## 2016-02-25 MED ORDER — SODIUM CHLORIDE 0.9 % IV SOLN
80.0000 mg | Freq: Once | INTRAVENOUS | Status: AC
  Administered 2016-02-25: 80 mg via INTRAVENOUS
  Filled 2016-02-25: qty 80

## 2016-02-25 MED ORDER — PANTOPRAZOLE SODIUM 40 MG IV SOLR: 40.0000 mg | Freq: Two times a day (BID) | INTRAVENOUS | Status: DC

## 2016-02-25 MED ORDER — SODIUM CHLORIDE 0.9 % IV SOLN
8.0000 mg/h | INTRAVENOUS | Status: DC
  Administered 2016-02-25 – 2016-02-26 (×5): 8 mg/h via INTRAVENOUS
  Filled 2016-02-25 (×10): qty 80

## 2016-02-25 MED ORDER — MUPIROCIN 2 % EX OINT
1.0000 "application " | TOPICAL_OINTMENT | Freq: Two times a day (BID) | CUTANEOUS | Status: DC
  Administered 2016-02-25 – 2016-02-28 (×6): 1 via NASAL
  Filled 2016-02-25: qty 22

## 2016-02-25 MED ORDER — CHLORHEXIDINE GLUCONATE CLOTH 2 % EX PADS
6.0000 | MEDICATED_PAD | Freq: Every day | CUTANEOUS | Status: DC
  Administered 2016-02-25 – 2016-02-28 (×4): 6 via TOPICAL

## 2016-02-25 NOTE — H&P (Addendum)
History and Physical    [name] [id number] DOB: [birth year] DOA: 02/25/2016   PCP: No primary care provider on file. Chief Complaint: No chief complaint on file.   HPI: [name] is a 83 y.o. female with medical history significant of R hip pain due to fall and "strain" about 1 week ago.  Patient was seen in the ED at [facility], given scripts for percocet and diclofenac and discharged.  Patient apparently developed nausea and 4 episodes of black vomit yesterday which prompted her to go back in to ED last night.  She also was having AMS.  ED Course: In ED work up is significant for HGB 9.3, BUN 77 and creat of 2.1 (presumably acute), repeat BUN 97 and creat down to 1.89 after IVF some 8 hours later.  No further episodes of hematemesis in ED.  WBC 16k.  Review of Systems: As per HPI otherwise 10 point review of systems negative.    Past Medical History:  Diagnosis Date  . GERD (gastroesophageal reflux disease)   . Gout   . HTN (hypertension)     Past Surgical History:  Procedure Laterality Date  . TOTAL HIP ARTHROPLASTY       reports that she has never smoked. She does not have any smokeless tobacco history on file. She reports that she does not drink alcohol or use drugs.  Allergies not on file  No family history on file. Son is currently sick with URI.   Prior to Admission medications   Not on File    Physical Exam: There were no vitals filed for this visit.    Constitutional: NAD, calm, comfortable Eyes: PERRL, lids and conjunctivae normal ENMT: Mucous membranes are moist. Posterior pharynx clear of any exudate or lesions.Normal dentition.  Neck: normal, supple, no masses, no thyromegaly Respiratory: clear to auscultation bilaterally, no wheezing, no crackles. Normal respiratory effort. No accessory muscle use.  Cardiovascular: Regular rate and rhythm, no murmurs / rubs / gallops. No extremity edema. 2+ pedal pulses. No carotid bruits.  Abdomen: no  tenderness, no masses palpated. No hepatosplenomegaly. Bowel sounds positive.  Musculoskeletal: no clubbing / cyanosis. No joint deformity upper and lower extremities. Good ROM, no contractures. Normal muscle tone.  Skin: no rashes, lesions, ulcers. No induration Neurologic: CN 2-12 grossly intact. Sensation intact, DTR normal. Strength 5/5 in all 4.  Psychiatric: Normal judgment and insight. Alert and oriented x 3. Normal mood.  Slight mental slowing.   Labs on Admission: I have personally reviewed following labs and imaging studies  CBC: No results for input(s): WBC, NEUTROABS, HGB, HCT, MCV, PLT in the last 168 hours. Basic Metabolic Panel: No results for input(s): NA, K, CL, CO2, GLUCOSE, BUN, CREATININE, CALCIUM, MG, PHOS in the last 168 hours. GFR: CrCl cannot be calculated (Patient's most recent lab result is older than the maximum 21 days allowed.). Liver Function Tests: No results for input(s): AST, ALT, ALKPHOS, BILITOT, PROT, ALBUMIN in the last 168 hours. No results for input(s): LIPASE, AMYLASE in the last 168 hours. No results for input(s): AMMONIA in the last 168 hours. Coagulation Profile: No results for input(s): INR, PROTIME in the last 168 hours. Cardiac Enzymes: No results for input(s): CKTOTAL, CKMB, CKMBINDEX, TROPONINI in the last 168 hours. BNP (last 3 results) No results for input(s): PROBNP in the last 8760 hours. HbA1C: No results for input(s): HGBA1C in the last 72 hours. CBG: No results for input(s): GLUCAP in the last 168 hours. Lipid Profile: No results for  input(s): CHOL, HDL, LDLCALC, TRIG, CHOLHDL, LDLDIRECT in the last 72 hours. Thyroid Function Tests: No results for input(s): TSH, T4TOTAL, FREET4, T3FREE, THYROIDAB in the last 72 hours. Anemia Panel: No results for input(s): VITAMINB12, FOLATE, FERRITIN, TIBC, IRON, RETICCTPCT in the last 72 hours. Urine analysis:    Component Value Date/Time   COLORURINE YELLOW 02/10/2007 0213   APPEARANCEUR  CLEAR 02/10/2007 0213   LABSPEC 1.008 02/10/2007 0213   PHURINE 6.5 02/10/2007 0213   GLUCOSEU NEGATIVE 02/10/2007 0213   HGBUR TRACE (A) 02/10/2007 0213   BILIRUBINUR NEGATIVE 02/10/2007 0213   KETONESUR NEGATIVE 02/10/2007 0213   PROTEINUR NEGATIVE 02/10/2007 0213   UROBILINOGEN 0.2 02/10/2007 0213   NITRITE NEGATIVE 02/10/2007 0213   LEUKOCYTESUR NEGATIVE 02/10/2007 0213   Sepsis Labs: @LABRCNTIP (procalcitonin:4,lacticidven:4) )No results found for this or any previous visit (from the past 240 hour(s)).   Radiological Exams on Admission: No results found.  EKG: Independently reviewed.  Assessment/Plan Principal Problem:   UGIB (upper gastrointestinal bleed) Active Problems:   AKI (acute kidney injury) ([place])   Acute blood loss anemia   Uremia    1. UGIB - 1. Call GI in AM 2. Repeat CBC now 3. Transfuse if needed 4. HGB 9.3 at [facility] 5. PPI GTT 6. Suspicious for NSAID induced gastric ulcer given the history. 2. Uremia - due to UGIB and AKI 1. Repeat BMP now 2. IVF 3. Hopefully BUN will trend down with resolution of UGIB and IVF 4. Suspect that her mild amount of AMS right now is secondary to this. 3. AKI - unknown baseline, but I am suspicious she may have some degree of CKD at baseline given that she is on allopurinol for gout 1. Strict intake and output 2. Daily BMPs 3. Hold allopurinol for the moment   DVT prophylaxis: SCDs Code Status: Full Family Communication: Son at bedside Consults called: None Admission status: Admit to inpatient   Etta Quill DO [facility] Pager [phone] from 7PM-7AM  If 7AM-7PM, please contact the day physician for the patient [email or web address] Password [email or web address]  02/25/2016, 5:06 AM

## 2016-02-25 NOTE — Progress Notes (Signed)
Progress note  Patient admitted earlier this morning, see H&P. She is quite a poor historian, lives at home with sister, but reportedly does well at home without history of formal dx dementia. Son is currently at bedside, but is not primary caretaker. She states that she has been taking oxycodone since her fell and hurt her right hip. She denies taking any aspirin, ibuprofen, advil, motrin, aleve, but again she is a poor historian overall. Over the past several days, she has been vomiting black material. She denies any abdominal pain. Per son, there has been no reports of dark tarry stool or bright red blood in stool. She admits to fatigue and dizziness. She was evaluated at [facility] where Hgb was 9.3. Repeat Hgb this morning is 7.4. Cr is improving overall. She has not had further vomiting episodes since [facility] ED around 2am. She does not think she has ever had a colonoscopy.   Vital signs stable currently GI consulted Continue Protonix IV  Dessa Phi, DO [facility] [email or web address] Password [email or web address] 02/25/2016, 7:41 AM

## 2016-02-25 NOTE — Telephone Encounter (Signed)
Update: BUN up to 97, creat down to 1.89.  She is urinating.  No beds at [facility], no SDU at [facility], no from [place].  At this point feel that while not ideal, patient would be better served at [facility] (which has GI coverage and upper EGD capability) than at [facility] (which has no GI).  I am still concerned about renal function and possible need for second transfer if this doesn't improve with medical therapy and treatment of GIB, but given no SDU at [facility] will send patient to SDU here at [facility].

## 2016-02-25 NOTE — Consult Note (Signed)
Referring Provider:  Dr. Dessa Phi Primary Care Physician:  Earney Mallet, MD Primary Gastroenterologist:   None (unassigned)  Reason for Consultation: Upper GI bleeding with posthemorrhagic anemia  HPI: [name] is a 83 y.o. female transferred from [facility] yesterday and admitted here because of a history of several episodes of coffee ground emesis yesterday evening, without further bleeding since her ER evaluation. She has significant posthemorrhagic anemia, with a hemoglobin of 7.4 (baseline hemoglobin not known, MCV normal suggesting acuity), with normal platelets and INR and elevated BUN of 85 (creatinine 1.59) further confirming upper tract origin.  The patient herself, on careful questioning and corroborated by her son, has not been on ulcerogenic medications such as aspirin or nonsteroidal anti-inflammatory drugs. 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 She is maintained on PPI therapy at home because of a history of GERD.  As far as I'm aware, the patient is not having any upper tract symptomatology such as dyspepsia, anorexia, weight loss, or active reflux symptomatology.  She has been hemodynamically stable while in the hospital. She has never had endoscopic evaluation and has no known history of ulcer disease.   Past Medical History:  Diagnosis Date  . Arthritis   . Back pain   . Cancer ([place])   . GERD (gastroesophageal reflux disease)   . Gout   . Gout   . History of left foot drop   . HTN (hypertension)   . Neuropathy ([place])   . Osteoarthritis   . Pneumonia   . Renal insufficiency     Past Surgical History:  Procedure Laterality Date  . ABDOMINAL HYSTERECTOMY    . CATARACT EXTRACTION, BILATERAL    . EPIDURAL STEROID INJECTION    .  MOHS SURGERY    . right carpal tunnel release    . TONSILLECTOMY    . TOTAL HIP ARTHROPLASTY      Prior to Admission medications   Medication Sig Start Date End Date Taking? Authorizing Provider  acetaminophen (TYLENOL) 500 MG tablet Take 1,000 mg by mouth every 6 (six) hours as needed for mild pain.    Yes Historical Provider, MD  allopurinol (ZYLOPRIM) 100 MG tablet Take 100 mg by mouth daily. 01/24/16  Yes Historical Provider, MD  amLODipine (NORVASC) 5 MG tablet Take 5 mg by mouth daily. 02/10/16  Yes Historical Provider, MD  benazepril-hydrochlorthiazide (LOTENSIN HCT) 20-12.5 MG tablet Take 1 tablet by mouth daily. 04/30/14  Yes Historical Provider, MD  Multiple Vitamin (MULTI-VITAMINS) TABS Take 1 tablet by mouth daily. 03/03/07  Yes Historical Provider, MD  pantoprazole (PROTONIX) 40 MG tablet Take 40 mg by mouth daily.   Yes Historical Provider, MD  Polyethyl Glycol-Propyl Glycol (SYSTANE OP) Place 1 drop into both eyes 2 (two) times daily as needed (For dry eyes.).   Yes Historical Provider, MD  pregabalin (LYRICA) 25 MG capsule Take 25 mg by mouth 2 (two) times daily.   Yes Historical Provider, MD    Current Facility-Administered Medications  Medication Dose Route Frequency Provider Last Rate Last Dose  . 0.9 %  sodium chloride infusion   Intravenous Continuous Etta Quill, DO 125 mL/hr at 02/25/16 1500    . Chlorhexidine Gluconate Cloth 2 % PADS 6 each  6 each Topical Q0600 Shon Millet, DO   6 each at  02/25/16 1100  . mupirocin ointment (BACTROBAN) 2 % 1 application  1 application Nasal BID Shon Millet, DO   1 application at [id number]  . pantoprazole (PROTONIX) 80 mg in sodium chloride 0.9 % 250 mL (0.32 mg/mL) infusion  8 mg/hr Intravenous Continuous Etta Quill, DO 25 mL/hr at 02/25/16 1522 8 mg/hr at 02/25/16 1522  . [START ON 02/28/2016] pantoprazole (PROTONIX) injection 40 mg  40 mg Intravenous Q12H Etta Quill, DO      . pregabalin  (LYRICA) capsule 25 mg  25 mg Oral BID Etta Quill, DO   25 mg at 02/25/16 1100    Allergies as of 02/25/2016 - Review Complete 02/25/2016  Allergen Reaction Noted  . Aspirin Other (See Comments) 02/25/2016  . Nsaids Other (See Comments) 02/25/2016  . Silver Rash 02/25/2016    Family History  Problem Relation Age of Onset  . Rheumatologic disease Mother   . Diabetes Mother     Social History   Social History  . Marital status: Married    Spouse name: N/A  . Number of children: N/A  . Years of education: N/A   Occupational History  . Not on file.   Social History Main Topics  . Smoking status: Never Smoker  . Smokeless tobacco: Never Used  . Alcohol use No  . Drug use: No  . Sexual activity: No   Other Topics Concern  . Not on file   Social History Narrative  . No narrative on file    Review of Systems: No dizziness, chest pain, shortness of breath, loss of appetite, loss of weight, abdominal symptoms, urinary symptoms.  Physical Exam: Vital signs in last 24 hours: Temp:  [97.8 F (36.6 C)-99.2 F (37.3 C)] 98.7 F (37.1 C) (02/06 1145) Pulse Rate:  [59-104] 90 (02/06 1500) Resp:  [14-27] 21 (02/06 1500) BP: (96-174)/(31-125) 142/58 (02/06 1500) SpO2:  [96 %-100 %] 96 % (02/06 1500) Weight:  [71 kg (156 lb 8.4 oz)] 71 kg (156 lb 8.4 oz) (02/06 0500)   General:   Alert,  Well-developed, well-nourished, pleasant and cooperative in NAD Head:  Normocephalic and atraumatic. Eyes:  Sclera clear, no icterus.    Neck:   No masses or thyromegaly. Lungs:  Clear throughout to auscultation.   No wheezes, crackles, or rhonchi. No evident respiratory distress. Heart:   Regular rate and rhythm; no murmurs, clicks, rubs,  or gallops. Abdomen:  Soft, nontender, and nondistended. No masses, hepatosplenomegaly or ventral hernias noted. Msk:   Symmetrical without gross deformities. Pulses:  Normal radial pulse is noted. Extremities:   Without clubbing, cyanosis, or  edema. Neurologic:  No focal deficits, but seems to have some trouble understanding what I'm saying and retaining information. Skin:  Intact without significant lesions or rashes. Cervical Nodes:  No significant cervical adenopathy. Psych:   Alert and cooperative. Seems periodically somewhat depressed and almost tearful during our conversation.   Intake/Output from previous day: 02/05 0701 - 02/06 0700 In: 217.5 [I.V.:117.5; IV Piggyback:100] Out: 300 [Urine:300] Intake/Output this shift: Total I/O In: 1350 [I.V.:1350] Out: 200 [Urine:200]  Lab Results:  Recent Labs  02/25/16 0646  WBC 11.0*  HGB 7.4*  HCT 22.0*  PLT 219   BMET  Recent Labs  02/25/16 0646  NA 141  K 4.0  CL 113*  CO2 21*  GLUCOSE 112*  BUN 85*  CREATININE 1.59*  CALCIUM 8.2*   LFT  Recent Labs  02/25/16 0646  PROT 5.1*  ALBUMIN 3.0*  AST 20  ALT 26  ALKPHOS 66  BILITOT 0.5   PT/INR  Recent Labs  02/25/16 0646  LABPROT 14.6  INR 1.13    Studies/Results: No results found.  Impression: 1. Upper GI bleed, subacute, without hemodynamic instability 2. Posthemorrhagic anemia, presumed acute  Plan: I spent approximately 45 minutes at the bedside talking with the patient and her son, Laverna Peace, going back and forth about whether or not to do endoscopic evaluation. I explained the nature, purpose, and risks of the procedure. As noted above, the patient seemed to have some general understanding that we want to check for bleeding, but was not really able to clearly articulate understanding of the nature of the procedure or its possible risks. She seemed to be concerned about the expense of the procedure, and I offered reassurance in that regard. I suggested the possibility of the patient's son, Laverna Peace, signing the consent on the patient's behalf because he was clearly in favor the procedure, but because the patient lives with her daughter, not with him, and because of family dynamics, he was not  comfortable signing the permit on her behalf. In view of all this, and taking into account the patient's current clinical stability, I did not feel that there was sufficient mandate to press ahead with emergent endoscopic evaluation in the absence of a clear valid consent from the patient herself, or a go ahead from the family member present. I have explained this to the patient's attending physician.  In the meantime, we will continue maximal antipeptic medical therapy with a Protonix infusion and I will add sucralfate for good measure.  I will obtain interim labs this afternoon, and will allow the patient a clear liquid diet.  We will revisit the idea of endoscopy tomorrow morning, perhaps by which time the patient's cognition might be improved to the point where she could clearly consent to the procedure. In the meantime, we could do an endoscopy emergently if her condition should destabilize.   LOS: 0 days   [place] V  02/25/2016, 3:41 PM   Pager [phone] If no answer or after 5 PM call [phone]

## 2016-02-26 DIAGNOSIS — D62 Acute posthemorrhagic anemia: Secondary | ICD-10-CM

## 2016-02-26 DIAGNOSIS — N179 Acute kidney failure, unspecified: Secondary | ICD-10-CM

## 2016-02-26 DIAGNOSIS — I10 Essential (primary) hypertension: Secondary | ICD-10-CM

## 2016-02-26 DIAGNOSIS — K922 Gastrointestinal hemorrhage, unspecified: Principal | ICD-10-CM

## 2016-02-26 DIAGNOSIS — N19 Unspecified kidney failure: Secondary | ICD-10-CM

## 2016-02-26 LAB — CBC WITH DIFFERENTIAL/PLATELET
BASOS ABS: 0 10*3/uL (ref 0.0–0.1)
Basophils Relative: 0 %
Eosinophils Absolute: 0.4 10*3/uL (ref 0.0–0.7)
Eosinophils Relative: 5 %
HEMATOCRIT: 22.1 % — AB (ref 36.0–46.0)
Hemoglobin: 7.4 g/dL — ABNORMAL LOW (ref 12.0–15.0)
LYMPHS ABS: 1.2 10*3/uL (ref 0.7–4.0)
LYMPHS PCT: 16 %
MCH: 31.6 pg (ref 26.0–34.0)
MCHC: 33.5 g/dL (ref 30.0–36.0)
MCV: 94.4 fL (ref 78.0–100.0)
MONO ABS: 0.8 10*3/uL (ref 0.1–1.0)
Monocytes Relative: 10 %
NEUTROS ABS: 5.3 10*3/uL (ref 1.7–7.7)
Neutrophils Relative %: 69 %
Platelets: 215 10*3/uL (ref 150–400)
RBC: 2.34 MIL/uL — AB (ref 3.87–5.11)
RDW: 14.6 % (ref 11.5–15.5)
WBC: 7.7 10*3/uL (ref 4.0–10.5)

## 2016-02-26 LAB — BASIC METABOLIC PANEL
ANION GAP: 6 (ref 5–15)
BUN: 42 mg/dL — ABNORMAL HIGH (ref 6–20)
CO2: 21 mmol/L — AB (ref 22–32)
Calcium: 8.4 mg/dL — ABNORMAL LOW (ref 8.9–10.3)
Chloride: 116 mmol/L — ABNORMAL HIGH (ref 101–111)
Creatinine, Ser: 1.11 mg/dL — ABNORMAL HIGH (ref 0.44–1.00)
GFR calc Af Amer: 51 mL/min — ABNORMAL LOW (ref >60)
GFR calc non Af Amer: 44 mL/min — ABNORMAL LOW (ref >60)
GLUCOSE: 100 mg/dL — AB (ref 65–99)
Potassium: 3.6 mmol/L (ref 3.5–5.1)
Sodium: 143 mmol/L (ref 135–145)

## 2016-02-26 LAB — ABO/RH: ABO/RH(D): O POS

## 2016-02-26 LAB — PREPARE RBC (CROSSMATCH)

## 2016-02-26 MED ORDER — FUROSEMIDE 10 MG/ML IJ SOLN
20.0000 mg | Freq: Once | INTRAMUSCULAR | Status: AC
  Administered 2016-02-26: 20 mg via INTRAVENOUS
  Filled 2016-02-26: qty 2

## 2016-02-26 MED ORDER — SODIUM CHLORIDE 0.9 % IV SOLN
Freq: Once | INTRAVENOUS | Status: AC
  Administered 2016-02-26: 16:00:00 via INTRAVENOUS

## 2016-02-26 NOTE — Progress Notes (Signed)
[facility] PROGRESS NOTE  [name] [id number] DOB: 1927/02/16 DOA: 02/25/2016 PCP: Earney Mallet, MD  Interim summary and HPI 83 y.o. female with medical history significant of R hip pain due to fall and "strain" about 1 week ago.  Patient was seen in the ED at [facility], given scripts for percocet and diclofenac and discharged.  Patient apparently developed nausea and 4 episodes of black vomit yesterday which prompted her to go back in to ED last night.  She also was having AMS. Found with positive FOBT, drop in Hgb, AKI and uremia. High concerns for acute upper GIB.   Assessment/Plan: 1-UGIB: patient with nausea and coffee ground emesis (4 episodes prior to admission). Recent initiation on Diclofenac and prior hx of GERD. -patient NSAID's discontinued -will also avoid heparin products -will continue IV PPI and carafate -follow Hgb trend  -given acute drop in Hgb and symptomatic anemia sx's will transfuse 1 unit -planning EGD on 02/27/16 -full liquid diet allowed by GI until midnight  -GI on board will follow rec's  2-acute blood loss anemia  -will stop NSAID's as mentioned above -transfuse 1 unit of PRBC's -follow hemoglobin trend   3- acute encephalopathy -due to uremia -essentially resolved now -will monitor and minimize use of agents that can altered mentation   4-uremia and AKI -appears to be secondary to GIB, low BP, continue use of nephrotoxic agents in wrong setting and use of NSAID's -significantly improved with IVF's -will transfuse 1 unit of PRBC's to improve/guarantee perfusion  -will follow renal function trend  -minimize/avoid nephrotoxic drugs  5-Gout -will resume allopurinol when fully able to tolerate PO's and once renal function back to normal   6-right hip pain: patient with fall a week ago, examined at [facility] and found to have a sprain  -will use PRN pain meds for now -once bleeding taken care of will pursuit repeat images studies  of her hip to r/o Fx -PT/OT will be ordered  -supportive care for now  7-HTN -patient was hypotensive on admission  -will hold all antihypertensive drugs for now  -BP stable currently   Code Status: Full Family Communication: no family at bedside  Disposition Plan: remains in stepdown, continue PPI drip and carafate. Will transfuse 1 unit of PRBC's; avoid heparin products and NSAID's. Diet advanced to full liquid diet.   Consultants:  [facility]  Procedures:  EGD planned for 02/27/16  Antibiotics:  None   HPI/Subjective: Afebrile, no CP, no SOB. Complaining of right hip pain. No nausea, no vomiting. Feeling weak and tired.  Objective: Vitals:   02/26/16 2000 02/26/16 2027  BP: (!) 180/58 (!) 171/61  Pulse: 82 85  Resp: 20 (!) 28  Temp:      Intake/Output Summary (Last 24 hours) at 02/26/16 2136 Last data filed at 02/26/16 2000  Gross per 24 hour  Intake           3427.5 ml  Output             4575 ml  Net          -1147.5 ml   Filed Weights   02/25/16 0500 02/26/16 0330  Weight: 71 kg (156 lb 8.4 oz) 69.5 kg (153 lb 3.5 oz)    Exam:   General:  AAOX2, reports feeling weak and tired. Patient denies CP and SOB. Endorses some pain in her hip.  Cardiovascular: S1 and S2, no rubs, no gallops  Respiratory: good air movement, no wheezing, no crackles  Abdomen: soft, NT, ND positive BS  Musculoskeletal: no edema, no cyanosis, no clubbing, limited ROM due to pain in her right hip.  Data Reviewed: Basic Metabolic Panel:  Recent Labs Lab 02/25/16 0646 02/25/16 1614 02/26/16 0400  NA 141 142 143  K 4.0 3.9 3.6  CL 113* 117* 116*  CO2 21* 19* 21*  GLUCOSE 112* 101* 100*  BUN 85* 61* 42*  CREATININE 1.59* 1.35* 1.11*  CALCIUM 8.2* 8.3* 8.4*   Liver Function Tests:  Recent Labs Lab 02/25/16 0646  AST 20  ALT 26  ALKPHOS 66  BILITOT 0.5  PROT 5.1*  ALBUMIN 3.0*   CBC:  Recent Labs Lab 02/25/16 0646 02/25/16 1614 02/26/16 0400  WBC 11.0*  9.8 7.7  NEUTROABS  --   --  5.3  HGB 7.4* 7.1* 7.4*  HCT 22.0* 20.9* 22.1*  MCV 91.3 92.9 94.4  PLT 219 205 215   CBG: No results for input(s): GLUCAP in the last 168 hours.  Recent Results (from the past 240 hour(s))  MRSA PCR Screening     Status: Abnormal   Collection Time: 02/25/16  5:02 AM  Result Value Ref Range Status   MRSA by PCR POSITIVE (A) NEGATIVE Final    Comment:        The GeneXpert MRSA Assay (FDA approved for NASAL specimens only), is one component of a comprehensive MRSA colonization surveillance program. It is not intended to diagnose MRSA infection nor to guide or monitor treatment for MRSA infections. RESULT CALLED TO, READ BACK BY AND VERIFIED WITH: BECK,P @ 0911 ON [id number] BY POTEAT,S   Culture, Urine     Status: Abnormal (Preliminary result)   Collection Time: 02/25/16  2:45 PM  Result Value Ref Range Status   Specimen Description URINE, CATHETERIZED  Final   Special Requests NONE  Final   Culture 80,000 COLONIES/mL KLEBSIELLA PNEUMONIAE (A)  Final   Report Status PENDING  Incomplete     Studies: No results found.  Scheduled Meds: . Chlorhexidine Gluconate Cloth  6 each Topical Q0600  . mupirocin ointment  1 application Nasal BID  . [START ON 02/28/2016] pantoprazole  40 mg Intravenous Q12H  . pregabalin  25 mg Oral BID  . sucralfate  1 g Oral TID WC & HS   Continuous Infusions: . sodium chloride 75 mL/hr at 02/26/16 2000  . pantoprozole (PROTONIX) infusion 8 mg/hr (02/26/16 2000)      Time spent: 30 minutes    Barton Dubois  [facility] Pager [phone]. If 7PM-7AM, please contact night-coverage at [email or web address], password [facility] 02/26/2016, 9:36 PM  LOS: 1 day

## 2016-02-26 NOTE — Progress Notes (Signed)
Stable.  VSS.  Chest clr, heart nl, pt alert and in NAD.  Hgb unchanged from 24 hrs ago.  BUN continues to come down.  Pt's dtr, Caren Griffins, is at bedside and both she, and now pt, are in favor of proceeding w/ egd.  Further hx:  Caren Griffins states pt was taking ibuprofen for 1 wk prior to her hematemesis, plus was given a "d" medicine that's hard on the gut for her sprained hip (?diclofenac???) that she was taking prior to the bleed as well.  However, Caren Griffins verified that pt has been taking Protonix 72m daily.  Pt might be still somewhat confused but CCaren Griffinsis willing to co-sign consent--purpose and risks reviewed w/ her.  Main purpose of egd at present would be for prognosis/mgt decisions.  IMPR:    1. UGIB, currently quiescent, probably due to NSAID gastropathy 2. Post-hemorrhagic anemia, severe, stable 3. 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 EGD scheduled for 9 a.m. Tomorrow under MAC anesthesia (not available remainder of today)---could scope sooner as emergency in unlikely event of destabilization.  2. Would favor transfusing 1 u prc's in view of pt's age and anticipated "stress" of egd--will defer to hospitalist on this.  3. Will allow full liq today  RCleotis Nipper M.D. Pager [phone] no answer or after 5 PM call [phone]

## 2016-02-27 ENCOUNTER — Encounter (HOSPITAL_COMMUNITY)
Admission: EM | Disposition: A | Discharge status: Home Health | Payer: Self-pay | Source: Other Acute Inpatient Hospital | Attending: Internal Medicine

## 2016-02-27 ENCOUNTER — Inpatient Hospital Stay (HOSPITAL_COMMUNITY): Payer: Medicare Other | Admitting: Certified Registered Nurse Anesthetist

## 2016-02-27 ENCOUNTER — Inpatient Hospital Stay (HOSPITAL_COMMUNITY): Payer: Medicare Other

## 2016-02-27 ENCOUNTER — Encounter (HOSPITAL_COMMUNITY): Payer: Self-pay | Admitting: *Deleted

## 2016-02-27 DIAGNOSIS — N390 Urinary tract infection, site not specified: Secondary | ICD-10-CM

## 2016-02-27 DIAGNOSIS — E876 Hypokalemia: Secondary | ICD-10-CM

## 2016-02-27 DIAGNOSIS — M25551 Pain in right hip: Secondary | ICD-10-CM

## 2016-02-27 HISTORY — PX: ESOPHAGOGASTRODUODENOSCOPY (EGD) WITH PROPOFOL: SHX5813

## 2016-02-27 LAB — URINE CULTURE: Culture: 80000 — AB

## 2016-02-27 LAB — CBC WITH DIFFERENTIAL/PLATELET
BASOS ABS: 0 10*3/uL (ref 0.0–0.1)
BASOS PCT: 0 %
Eosinophils Absolute: 0.2 10*3/uL (ref 0.0–0.7)
Eosinophils Relative: 2 %
HEMATOCRIT: 26.4 % — AB (ref 36.0–46.0)
Hemoglobin: 9.2 g/dL — ABNORMAL LOW (ref 12.0–15.0)
Lymphocytes Relative: 17 %
Lymphs Abs: 1.6 10*3/uL (ref 0.7–4.0)
MCH: 31.6 pg (ref 26.0–34.0)
MCHC: 34.8 g/dL (ref 30.0–36.0)
MCV: 90.7 fL (ref 78.0–100.0)
MONO ABS: 0.9 10*3/uL (ref 0.1–1.0)
MONOS PCT: 10 %
NEUTROS ABS: 6.4 10*3/uL (ref 1.7–7.7)
Neutrophils Relative %: 71 %
PLATELETS: 218 10*3/uL (ref 150–400)
RBC: 2.91 MIL/uL — ABNORMAL LOW (ref 3.87–5.11)
RDW: 14.2 % (ref 11.5–15.5)
WBC: 9.1 10*3/uL (ref 4.0–10.5)

## 2016-02-27 LAB — BASIC METABOLIC PANEL
ANION GAP: 8 (ref 5–15)
BUN: 24 mg/dL — ABNORMAL HIGH (ref 6–20)
CALCIUM: 8.6 mg/dL — AB (ref 8.9–10.3)
CO2: 24 mmol/L (ref 22–32)
CREATININE: 1.05 mg/dL — AB (ref 0.44–1.00)
Chloride: 111 mmol/L (ref 101–111)
GFR, EST AFRICAN AMERICAN: 54 mL/min — AB (ref >60)
GFR, EST NON AFRICAN AMERICAN: 47 mL/min — AB (ref >60)
Glucose, Bld: 110 mg/dL — ABNORMAL HIGH (ref 65–99)
Potassium: 3.1 mmol/L — ABNORMAL LOW (ref 3.5–5.1)
SODIUM: 143 mmol/L (ref 135–145)

## 2016-02-27 SURGERY — ESOPHAGOGASTRODUODENOSCOPY (EGD) WITH PROPOFOL
Anesthesia: Monitor Anesthesia Care

## 2016-02-27 MED ORDER — PROPOFOL 10 MG/ML IV BOLUS
INTRAVENOUS | Status: DC | PRN
  Administered 2016-02-27: 20 mg via INTRAVENOUS
  Administered 2016-02-27: 10 mg via INTRAVENOUS
  Administered 2016-02-27: 40 mg via INTRAVENOUS
  Administered 2016-02-27: 20 mg via INTRAVENOUS

## 2016-02-27 MED ORDER — FENTANYL CITRATE (PF) 100 MCG/2ML IJ SOLN: 25.0000 ug | INTRAMUSCULAR | Status: DC | PRN

## 2016-02-27 MED ORDER — ONDANSETRON HCL 4 MG/2ML IJ SOLN: 4.0000 mg | Freq: Once | INTRAMUSCULAR | Status: DC | PRN

## 2016-02-27 MED ORDER — LIDOCAINE 2% (20 MG/ML) 5 ML SYRINGE
INTRAMUSCULAR | Status: DC | PRN
  Administered 2016-02-27: 120 mg via INTRAVENOUS

## 2016-02-27 MED ORDER — LACTATED RINGERS IV SOLN
INTRAVENOUS | Status: DC
  Administered 2016-02-27: 09:00:00 via INTRAVENOUS

## 2016-02-27 MED ORDER — PROPOFOL 10 MG/ML IV BOLUS
INTRAVENOUS | Status: AC
  Filled 2016-02-27: qty 20

## 2016-02-27 MED ORDER — PROPOFOL 500 MG/50ML IV EMUL
INTRAVENOUS | Status: DC | PRN
  Administered 2016-02-27: 100 ug/kg/min via INTRAVENOUS

## 2016-02-27 MED ORDER — SODIUM CHLORIDE 0.9 % IV SOLN
30.0000 meq | Freq: Once | INTRAVENOUS | Status: AC
  Administered 2016-02-27: 30 meq via INTRAVENOUS
  Filled 2016-02-27: qty 15

## 2016-02-27 MED ORDER — AMLODIPINE BESYLATE 5 MG PO TABS
5.0000 mg | ORAL_TABLET | Freq: Every day | ORAL | Status: DC
  Administered 2016-02-27 – 2016-02-28 (×2): 5 mg via ORAL
  Filled 2016-02-27 (×2): qty 1

## 2016-02-27 MED ORDER — LIDOCAINE 2% (20 MG/ML) 5 ML SYRINGE
INTRAMUSCULAR | Status: AC
  Filled 2016-02-27: qty 10

## 2016-02-27 MED ORDER — SODIUM CHLORIDE 0.9 % IV SOLN: INTRAVENOUS | Status: DC

## 2016-02-27 MED ORDER — CIPROFLOXACIN HCL 500 MG PO TABS
500.0000 mg | ORAL_TABLET | Freq: Two times a day (BID) | ORAL | Status: DC
  Administered 2016-02-27 – 2016-02-28 (×3): 500 mg via ORAL
  Filled 2016-02-27 (×4): qty 1

## 2016-02-27 SURGICAL SUPPLY — 14 items

## 2016-02-27 NOTE — Evaluation (Signed)
Physical Therapy Evaluation Patient Details Name: [name] MRN: [id number] DOB: 1927-09-18 Today's Date: 02/27/2016   History of Present Illness  [name] is a 83 y.o. female with medical history significant of R hip pain due to fall and "strain" about 1 week ago.  Patient was seen in the ED at [facility], given scripts for percocet and diclofenac and discharged.  Patient apparently developed nausea and 4 episodes of black vomity which prompted her to go back in to ED 02/25/16 .  She also was having AMS..  EGD performed 02/27/16  Clinical Impression  The patient is anxious and impulsive. Per daughter, patent was ambulating short distances  PTA. Today she stood at [facility] and took a few steps. Limited Evaluation due to drop in BP. Previous BP before PT supine BP 173/61, after assisted to sitting 110/31, after a stand 89/24,. After return to supine 152/54. RN aware. Pt admitted with above diagnosis. Pt currently with functional limitations due to the deficits listed below (see PT Problem List).  Pt will benefit from skilled PT to increase their independence and safety with mobility to allow discharge to the venue listed below.       Follow Up Recommendations SNF;Supervision/Assistance - 24 hour    Equipment Recommendations  None recommended by PT    Recommendations for Other Services       Precautions / Restrictions Precautions Precautions: Fall Precaution Comments: orthostatic hypotension -significant drop      Mobility  Bed Mobility Overal bed mobility: Needs Assistance Bed Mobility: Supine to Sit;Sit to Supine     Supine to sit: Mod assist Sit to supine: Min guard   General bed mobility comments: patient moving very quickly.  cues for safety. Assist with trunk to upright and legs over bed edge. Able to self assist back into the bed.  Transfers Overall transfer level: Needs assistance Equipment used: Rolling walker (2 wheeled) Transfers: Sit to/from Stand Sit to Stand: Mod  assist;+2 safety/equipment         General transfer comment: assist to power up. side steps x 4 along bed edge with RW.  Bears weight on the right leg.  Ambulation/Gait                Stairs            Wheelchair Mobility    Modified Rankin (Stroke Patients Only)       Balance Overall balance assessment: History of Falls;Needs assistance Sitting-balance support: Feet supported;Bilateral upper extremity supported Sitting balance-Leahy Scale: Fair     Standing balance support: During functional activity;Bilateral upper extremity supported Standing balance-Leahy Scale: Poor                               Pertinent Vitals/Pain Pain Assessment: 0-10 Pain Score: 10-Worst pain ever Pain Location: right hip, although patient was moving the leg quite freely during mobility Pain Descriptors / Indicators: Aching;Discomfort Pain Intervention(s): Monitored during session    Home Living Family/patient expects to be discharged to:: Private residence Living Arrangements: Children Available Help at Discharge: Family;Available 24 hours/day Type of Home: House Home Access: Level entry     Home Layout: One level Home Equipment: Walker - 2 wheels      Prior Function Level of Independence: Needs assistance   Gait / Transfers Assistance Needed: since hip injury, ambu;ated to Bathroom, otherwaise in bed.  ADL's / Homemaking Assistance Needed: daughter assists  Hand Dominance        Extremity/Trunk Assessment   Upper Extremity Assessment Upper Extremity Assessment: Overall WFL for tasks assessed    Lower Extremity Assessment Lower Extremity Assessment: Generalized weakness    Cervical / Trunk Assessment Cervical / Trunk Assessment: Normal  Communication      Cognition Arousal/Alertness: Awake/alert Behavior During Therapy: Impulsive;Anxious Overall Cognitive Status: Impaired/Different from baseline Area of Impairment:  Orientation;Following commands;Safety/judgement;Awareness Orientation Level: Time     Following Commands: Follows one step commands with increased time Safety/Judgement: Decreased awareness of safety Awareness: Emergent  The patient rambling at times and  Noted to have some difficulty with expression, tangential.   General Comments: requires frequent cues to  stay on task    General Comments      Exercises     Assessment/Plan    PT Assessment Patient needs continued PT services  PT Problem List Decreased strength;Decreased activity tolerance;Decreased mobility;Decreased balance;Cardiopulmonary status limiting activity;Decreased knowledge of precautions;Decreased safety awareness;Decreased cognition          PT Treatment Interventions DME instruction;Gait training;Functional mobility training;Therapeutic activities;Therapeutic exercise;Patient/family education    PT Goals (Current goals can be found in the Care Plan section)  Acute Rehab PT Goals Patient Stated Goal: to walk again PT Goal Formulation: With patient/family Time For Goal Achievement: 03/12/16 Potential to Achieve Goals: Good    Frequency Min 3X/week   Barriers to discharge Decreased caregiver support      Co-evaluation               End of Session Equipment Utilized During Treatment: Gait belt Activity Tolerance: Treatment limited secondary to medical complications (Comment) (orthostatic BP) Patient left: in bed;with call bell/phone within reach;with bed alarm set;with family/visitor present Nurse Communication: Mobility status (low BP)         Time: [id number] PT Time Calculation (min) (ACUTE ONLY): 23 min   Charges:   PT Evaluation $PT Eval Low Complexity: 1 Procedure PT Treatments $Therapeutic Activity: 8-22 mins   PT G Codes:        Claretha Cooper 02/27/2016, 5:20 PM  Tresa Endo PT [phone]

## 2016-02-27 NOTE — Transfer of Care (Signed)
Immediate Anesthesia Transfer of Care Note  Patient: [name]  Procedure(s) Performed: Procedure(s): ESOPHAGOGASTRODUODENOSCOPY (EGD) WITH PROPOFOL (N/A)  Patient Location: PACU  Anesthesia Type:MAC  Level of Consciousness: Patient easily awoken, sedated, comfortable, cooperative, following commands, responds to stimulation.   Airway & Oxygen Therapy: Patient spontaneously breathing, ventilating well, oxygen via simple oxygen mask.  Post-op Assessment: Report given to PACU RN, vital signs reviewed and stable, moving all extremities.   Post vital signs: Reviewed and stable.  Complications: No apparent anesthesia complications  Last Vitals:  Vitals:   02/27/16 0800 02/27/16 0834  BP: (!) 164/55 (!) 166/57  Pulse: 81 81  Resp: (!) 75 15  Temp:  37 C    Last Pain:  Vitals:   02/27/16 0834  TempSrc: Oral         Complications: No apparent anesthesia complications

## 2016-02-27 NOTE — Anesthesia Preprocedure Evaluation (Signed)
Anesthesia Evaluation  Patient identified by MRN, date of birth, ID band Patient awake    Reviewed: Allergy & Precautions, NPO status , Patient's Chart, lab work & pertinent test results  Airway Mallampati: II  TM Distance: >3 FB Neck ROM: Full    Dental  (+) Teeth Intact, Dental Advisory Given   Pulmonary neg pulmonary ROS,    Pulmonary exam normal breath sounds clear to auscultation       Cardiovascular hypertension, Pt. on medications Normal cardiovascular exam Rhythm:Regular Rate:Normal     Neuro/Psych Left foot drop Neuropathy  negative psych ROS   GI/Hepatic Neg liver ROS, GERD  Medicated,  Endo/Other  negative endocrine ROS  Renal/GU Renal InsufficiencyRenal disease     Musculoskeletal  (+) Arthritis ,   Abdominal   Peds  Hematology  (+) Blood dyscrasia, anemia ,   Anesthesia Other Findings Day of surgery medications reviewed with the patient.  Reproductive/Obstetrics                             Anesthesia Physical Anesthesia Plan  ASA: III  Anesthesia Plan: MAC   Post-op Pain Management:    Induction: Intravenous  Airway Management Planned: Nasal Cannula  Additional Equipment:   Intra-op Plan:   Post-operative Plan:   Informed Consent: I have reviewed the patients History and Physical, chart, labs and discussed the procedure including the risks, benefits and alternatives for the proposed anesthesia with the patient or authorized representative who has indicated his/her understanding and acceptance.   Dental advisory given  Plan Discussed with: CRNA and Anesthesiologist  Anesthesia Plan Comments: (Discussed risks/benefits/alternatives to MAC sedation including need for ventilatory support, hypotension, need for conversion to general anesthesia.  All patient questions answered.  Patient/guardian wishes to proceed.)        Anesthesia Quick Evaluation

## 2016-02-27 NOTE — Progress Notes (Signed)
[facility] PROGRESS NOTE  [name] [id number] DOB: 1927-07-15 DOA: 02/25/2016 PCP: Earney Mallet, MD  Interim summary and HPI 83 y.o. female with medical history significant of R hip pain due to fall and "strain" about 1 week ago.  Patient was seen in the ED at [facility], given scripts for percocet and diclofenac and discharged.  Patient apparently developed nausea and 4 episodes of black vomit yesterday which prompted her to go back in to ED last night.  She also was having AMS. Found with positive FOBT, drop in Hgb, AKI and uremia. High concerns for acute upper GIB.   Assessment/Plan: 1-UGIB: patient with nausea and coffee ground emesis (4 episodes prior to admission). Recent initiation on Diclofenac and prior hx of GERD. -patient NSAID's discontinued -will continue avoiding heparin products; SCD's for DVT prophylaxis  -S/P 1 unit of PRBC on 02/26/16 -S/P EGD on 02/27/16; with findings suggesting esophagitis. No acute bleeding seen -Soft diet and continue IV PPI until 2/9 as per GI recommendations   2-acute blood loss anemia  -will stop NSAID's as mentioned above -S/P 1 unit of PRBC's on 02/26/16 -will follow hemoglobin trend  -Hgb currently 9.2  3- acute encephalopathy -due to uremia and UTI -essentially resolved now -will monitor and minimize agents that can altered mentation   4-uremia and AKI -appears to be secondary to GIB, low BP, continue use of nephrotoxic agents in wrong setting, use of NSAID's and UTI -significantly improved with IVF's and transfusion of 1 unit of PRBC's -will follow renal function trend  -minimize/avoid nephrotoxic drugs -given findings for UTI will also treat infection, see below  5-Gout -will resume allopurinol when fully able to tolerate PO's and once renal function back to normal   6-right hip pain: patient with fall a week ago, examined at [facility] and found to have a sprain  -will use PRN pain meds for now -will check CT of  her right hip and depending results will discuss case with orthopedic service  -PT/OT will be ordered  -supportive care for now  7-HTN -patient BP is rising -will resume norvasc -most likely by tomorrow will resume home antihypertensive regimen (last 2 meds on hold to allow full recovery from kidney standpoint)  8-Klebsiella Pneumoniae UTI:  -after discussing with patient she endorses increase frequency -given initial confusional state will treat with 5 days of antibiotics -probably UTI also contributing to her AKI  9-hypokalemia: -will replete and follow electrolytes trend   Code Status: Full Family Communication: no family at bedside  Disposition Plan: will transfer to med-surg bed, continue PPI BID and carafate. Will check CT right hip and ask for PT/OT evaluation. Patient tolerated EGD w/o problems and is currently not having signs of active bleeding. Diet advance to soft diet.   Consultants:  [facility]  Procedures:  EGD 02/27/16 - No active bleeding or blood in the stomach at the time of this exam. - Normal larynx. - Severe acute and erosive esophagitis consistent with pill-induced esophagitis based on location and character. -Non-bleeding esophageal ulcer at GE junction. - Non-bleeding pyloric channel ulcers with no stigmata of bleeding - A single gastric polyp.  - Bilious gastric fluid.  Antibiotics:  None   HPI/Subjective: Afebrile, no CP, no SOB. Continue complaining of right hip pain. No nausea, no vomiting. Feeling much better overall today and ready for EGD. No further hematemesis or Melena reported.  Objective: Vitals:   02/27/16 1025 02/27/16 1030  BP: (!) 181/66   Pulse: 77 76  Resp: 14 17  Temp:      Intake/Output Summary (Last 24 hours) at 02/27/16 1112 Last data filed at 02/27/16 0955  Gross per 24 hour  Intake             3055 ml  Output             4625 ml  Net            -1570 ml   Filed Weights   02/25/16 0500 02/26/16 0330  Weight: 71  kg (156 lb 8.4 oz) 69.5 kg (153 lb 3.5 oz)    Exam:   General:  AAOX3, reports feeling weak and tired. Patient denies CP and SOB. Endorses still having some pain in her hip. No further nausea, hematemesis or melena reported.  Cardiovascular: S1 and S2, no rubs, no gallops  Respiratory: good air movement, no wheezing, no crackles   Abdomen: soft, NT, ND positive BS  Musculoskeletal: no edema, no cyanosis, no clubbing, limited ROM due to pain in her right hip.  Data Reviewed: Basic Metabolic Panel:  Recent Labs Lab 02/25/16 0646 02/25/16 1614 02/26/16 0400 02/27/16 0401  NA 141 142 143 143  K 4.0 3.9 3.6 3.1*  CL 113* 117* 116* 111  CO2 21* 19* 21* 24  GLUCOSE 112* 101* 100* 110*  BUN 85* 61* 42* 24*  CREATININE 1.59* 1.35* 1.11* 1.05*  CALCIUM 8.2* 8.3* 8.4* 8.6*   Liver Function Tests:  Recent Labs Lab 02/25/16 0646  AST 20  ALT 26  ALKPHOS 66  BILITOT 0.5  PROT 5.1*  ALBUMIN 3.0*   CBC:  Recent Labs Lab 02/25/16 0646 02/25/16 1614 02/26/16 0400 02/27/16 0401  WBC 11.0* 9.8 7.7 9.1  NEUTROABS  --   --  5.3 6.4  HGB 7.4* 7.1* 7.4* 9.2*  HCT 22.0* 20.9* 22.1* 26.4*  MCV 91.3 92.9 94.4 90.7  PLT 219 205 215 218   CBG: No results for input(s): GLUCAP in the last 168 hours.  Recent Results (from the past 240 hour(s))  MRSA PCR Screening     Status: Abnormal   Collection Time: 02/25/16  5:02 AM  Result Value Ref Range Status   MRSA by PCR POSITIVE (A) NEGATIVE Final    Comment:        The GeneXpert MRSA Assay (FDA approved for NASAL specimens only), is one component of a comprehensive MRSA colonization surveillance program. It is not intended to diagnose MRSA infection nor to guide or monitor treatment for MRSA infections. RESULT CALLED TO, READ BACK BY AND VERIFIED WITH: BECK,P @ 0911 ON IS:[id number] BY POTEAT,S   Culture, Urine     Status: Abnormal   Collection Time: 02/25/16  2:45 PM  Result Value Ref Range Status   Specimen Description  URINE, CATHETERIZED  Final   Special Requests NONE  Final   Culture 80,000 COLONIES/mL KLEBSIELLA PNEUMONIAE (A)  Final   Report Status 02/27/2016 FINAL  Final   Organism ID, Bacteria KLEBSIELLA PNEUMONIAE (A)  Final      Susceptibility   Klebsiella pneumoniae - MIC*    AMPICILLIN RESISTANT Resistant     CEFAZOLIN <=4 SENSITIVE Sensitive     CEFTRIAXONE <=1 SENSITIVE Sensitive     CIPROFLOXACIN <=0.25 SENSITIVE Sensitive     GENTAMICIN <=1 SENSITIVE Sensitive     IMIPENEM 0.5 SENSITIVE Sensitive     NITROFURANTOIN 128 RESISTANT Resistant     TRIMETH/SULFA <=20 SENSITIVE Sensitive     AMPICILLIN/SULBACTAM 4 SENSITIVE Sensitive  PIP/TAZO <=4 SENSITIVE Sensitive     Extended ESBL NEGATIVE Sensitive     * 80,000 COLONIES/mL KLEBSIELLA PNEUMONIAE     Studies: No results found.  Scheduled Meds: . [MAR Hold] amLODipine  5 mg Oral Daily  . [MAR Hold] Chlorhexidine Gluconate Cloth  6 each Topical Q0600  . [MAR Hold] mupirocin ointment  1 application Nasal BID  . [MAR Hold] pantoprazole  40 mg Intravenous Q12H  . [MAR Hold] potassium chloride (KCL MULTIRUN) 30 mEq in 265 mL IVPB  30 mEq Intravenous Once  . [MAR Hold] pregabalin  25 mg Oral BID  . [MAR Hold] sucralfate  1 g Oral TID WC & HS   Continuous Infusions: . sodium chloride 75 mL/hr at 02/27/16 0800  . lactated ringers 10 mL/hr at 02/27/16 0841  . pantoprozole (PROTONIX) infusion 8 mg/hr (02/27/16 0800)      Time spent: 25 minutes    Barton Dubois  [facility] Pager [phone]. If 7PM-7AM, please contact night-coverage at [email or web address], password [facility] 02/27/2016, 11:12 AM  LOS: 2 days

## 2016-02-27 NOTE — Progress Notes (Signed)
EGD well-tolerated.  Please see procedure rept.    Appears to be at low rsik for re-bleeding, ok to move to floor bed.  Have ordered mech soft diet.  Would continue IV PPI for now b/o esophagitis.  Possibly home tomorrow from GI perspective if stable, although she may need therapy to address her hip pain and that may be the rate-limiting step in her dischg.  Cleotis Nipper, M.D. Pager [phone] If no answer or after 5 PM call [phone]

## 2016-02-27 NOTE — Progress Notes (Signed)
Patient stayed in recovery in Endoscopy and then CT came and picked patient up for a CT scan. Report was called to the floor nurse.

## 2016-02-27 NOTE — Anesthesia Postprocedure Evaluation (Signed)
Anesthesia Post Note  Patient: [name]  Procedure(s) Performed: Procedure(s) (LRB): ESOPHAGOGASTRODUODENOSCOPY (EGD) WITH PROPOFOL (N/A)  Patient location during evaluation: Endoscopy Anesthesia Type: MAC Level of consciousness: awake and alert Pain management: pain level controlled Vital Signs Assessment: post-procedure vital signs reviewed and stable Respiratory status: spontaneous breathing, nonlabored ventilation, respiratory function stable and patient connected to nasal cannula oxygen Cardiovascular status: stable and blood pressure returned to baseline Anesthetic complications: no       Last Vitals:  Vitals:   02/27/16 1200 02/27/16 1300  BP: (!) 172/61   Pulse: 81 87  Resp: 14 14  Temp: 36.8 C     Last Pain:  Vitals:   02/27/16 1200  TempSrc: Oral                 Catalina Gravel

## 2016-02-27 NOTE — Op Note (Signed)
[facility] Patient Name: [name] Procedure Date: 02/27/2016 MRN: [id number] Attending MD: Ronald Lobo , MD Date of Birth: 03/18/1927 CSN: [id number] Age: 83 Admit Type: Inpatient Procedure:                Upper GI endoscopy Indications:              Coffee-ground emesis, hgb 7.4 Providers:                Ronald Lobo, MD, Hilma Favors, RN, William Dalton, Technician Referring MD:              Medicines:                Monitored Anesthesia Care Complications:            No immediate complications. Estimated Blood Loss:     Estimated blood loss: none. Procedure:                Pre-Anesthesia Assessment:                           - Prior to the procedure, a History and Physical                            was performed, and patient medications and                            allergies were reviewed. The patient's tolerance of                            previous anesthesia was also reviewed. The risks                            and benefits of the procedure and the sedation                            options and risks were discussed with the patient.                            All questions were answered, and informed consent                            was obtained. Prior Anticoagulants: The patient has                            taken previous NSAID medication, last dose was 5                            days prior to procedure. ASA Grade Assessment: III                            - A patient with severe systemic disease. After  reviewing the risks and benefits, the patient was                            deemed in satisfactory condition to undergo the                            procedure.                           After obtaining informed consent, the endoscope was                            passed under direct vision. Throughout the                            procedure, the patient's blood pressure, pulse,  and                            oxygen saturations were monitored continuously. The                            Endoscope was introduced through the mouth, and                            advanced to the second part of duodenum. The upper                            GI endoscopy was accomplished without difficulty.                            The patient tolerated the procedure well. Scope In: Scope Out: Findings:      The larynx was normal.      Severe localized distal esophagitis with no bleeding was found; it       appeared hemorrhagic and exudative, but without active bleeding. There       were two areas of this erosive change, on contralateral walls of the       esophagus. No mass appreciated.      One cratered esophageal ulcer with no bleeding and no stigmata of recent       bleeding was found right at the GE junction. The lesion was 7 mm in       largest dimension.      There is no endoscopic evidence of hiatal hernia, stricture, varices or       Mallory-Weiss tear in the entire esophagus.      Two non-bleeding superficial gastric ulcers with no stigmata of bleeding       were found at the pylorus. The largest lesion was 9 mm in largest       dimension.      A single 6 mm semi-sessile polyp was found in the gastric fundus.      The exam of the stomach was otherwise normal.      Bilious fluid was found on the greater curvature of the stomach, but no       blood or coffee grounds were seen. Impression:               -  No active bleeding or blood in the stomach at the                            time of this exam.                           - Normal larynx.                           - Severe acute and erosive esophagitis consistent                            with pill-induced esophagitis based on location and                            character.                           - Non-bleeding esophageal ulcer at GE junction.                           - Non-bleeding pyloric channel ulcers with  no                            stigmata of bleeding.                           - A single gastric polyp.                           - Bilious gastric fluid.                           - No specimens collected. Moderate Sedation:      This patient was sedated with monitored anesthesia care, not moderate       sedation. Recommendation:           - Continue present medications. Avoid NSAID's.                           - Low risk of further bleeding--ok to eat.                           - Consider repeat upper endoscopy in 6 weeks to                            check healing.                           - Mechanical soft diet today. Procedure Code(s):        --- Professional ---                           [phone], Esophagogastroduodenoscopy, flexible,                            transoral; diagnostic, including collection of  specimen(s) by brushing or washing, when performed                            (separate procedure) Diagnosis Code(s):        --- Professional ---                           K20.8, Other esophagitis                           K22.10, Ulcer of esophagus without bleeding                           K25.9, Gastric ulcer, unspecified as acute or                            chronic, without hemorrhage or perforation                           K92.0, Hematemesis CPT copyright 2016 [employer]. All rights reserved. The codes documented in this report are preliminary and upon coder review may  be revised to meet current compliance requirements. Ronald Lobo, MD 02/27/2016 10:11:09 AM This report has been signed electronically. Number of Addenda: 0

## 2016-02-28 ENCOUNTER — Encounter (HOSPITAL_COMMUNITY): Payer: Self-pay | Admitting: Gastroenterology

## 2016-02-28 DIAGNOSIS — I10 Essential (primary) hypertension: Secondary | ICD-10-CM

## 2016-02-28 DIAGNOSIS — K219 Gastro-esophageal reflux disease without esophagitis: Secondary | ICD-10-CM

## 2016-02-28 DIAGNOSIS — W19XXXS Unspecified fall, sequela: Secondary | ICD-10-CM

## 2016-02-28 DIAGNOSIS — W19XXXA Unspecified fall, initial encounter: Secondary | ICD-10-CM

## 2016-02-28 LAB — BASIC METABOLIC PANEL
ANION GAP: 7 (ref 5–15)
BUN: 14 mg/dL (ref 6–20)
CO2: 24 mmol/L (ref 22–32)
Calcium: 8.5 mg/dL — ABNORMAL LOW (ref 8.9–10.3)
Chloride: 110 mmol/L (ref 101–111)
Creatinine, Ser: 1.07 mg/dL — ABNORMAL HIGH (ref 0.44–1.00)
GFR calc Af Amer: 53 mL/min — ABNORMAL LOW (ref >60)
GFR, EST NON AFRICAN AMERICAN: 46 mL/min — AB (ref >60)
Glucose, Bld: 113 mg/dL — ABNORMAL HIGH (ref 65–99)
POTASSIUM: 3.2 mmol/L — AB (ref 3.5–5.1)
SODIUM: 141 mmol/L (ref 135–145)

## 2016-02-28 LAB — TYPE AND SCREEN
ABO/RH(D): O POS
Antibody Screen: NEGATIVE
Unit division: 0

## 2016-02-28 LAB — CBC WITH DIFFERENTIAL/PLATELET
BASOS ABS: 0 10*3/uL (ref 0.0–0.1)
BASOS PCT: 0 %
EOS ABS: 0.4 10*3/uL (ref 0.0–0.7)
EOS PCT: 5 %
HCT: 26.6 % — ABNORMAL LOW (ref 36.0–46.0)
Hemoglobin: 9.1 g/dL — ABNORMAL LOW (ref 12.0–15.0)
LYMPHS PCT: 20 %
Lymphs Abs: 1.4 10*3/uL (ref 0.7–4.0)
MCH: 31.4 pg (ref 26.0–34.0)
MCHC: 34.2 g/dL (ref 30.0–36.0)
MCV: 91.7 fL (ref 78.0–100.0)
Monocytes Absolute: 0.9 10*3/uL (ref 0.1–1.0)
Monocytes Relative: 13 %
Neutro Abs: 4.4 10*3/uL (ref 1.7–7.7)
Neutrophils Relative %: 62 %
PLATELETS: 228 10*3/uL (ref 150–400)
RBC: 2.9 MIL/uL — AB (ref 3.87–5.11)
RDW: 14.2 % (ref 11.5–15.5)
WBC: 7.1 10*3/uL (ref 4.0–10.5)

## 2016-02-28 LAB — MAGNESIUM: MAGNESIUM: 1.7 mg/dL (ref 1.7–2.4)

## 2016-02-28 MED ORDER — TRAMADOL-ACETAMINOPHEN 37.5-325 MG PO TABS: 1.0000 | ORAL_TABLET | Freq: Three times a day (TID) | ORAL | 0 refills | Status: DC | PRN

## 2016-02-28 MED ORDER — NIFEREX PO TABS: 150.0000 mg | ORAL_TABLET | Freq: Every day | ORAL | 1 refills | Status: AC

## 2016-02-28 MED ORDER — CIPROFLOXACIN HCL 500 MG PO TABS: 500.0000 mg | ORAL_TABLET | Freq: Two times a day (BID) | ORAL | 0 refills | Status: AC

## 2016-02-28 MED ORDER — PANTOPRAZOLE SODIUM 40 MG PO TBEC: 40.0000 mg | DELAYED_RELEASE_TABLET | Freq: Two times a day (BID) | ORAL | 0 refills | Status: DC

## 2016-02-28 MED ORDER — ACETAMINOPHEN 500 MG PO TABS: 1000.0000 mg | ORAL_TABLET | Freq: Three times a day (TID) | ORAL | 0 refills | Status: AC | PRN

## 2016-02-28 NOTE — Progress Notes (Signed)
CSW consulted to assist with d/c planning. Pt receiving care this am when CSW attempted to meet with pt. CSW spoke with pt's daughter to assist with d/c planning. PT has recommended SNF at d/c. Pt's daughter is considering this option but has not yet made a decision. Daughter will contact CSW later this am with her decision. Pt's spouse is a resident at [facility] in [place] and daughter reports that pt may have a SNF bed there if they decide to accept SNF placement. Daughter has requested that CSW not contact SNF at this time but to wait for her to first make a decision regarding placement. CSW will continue to follow to assist wit d/c planning.  Werner Lean LCSW [phone]

## 2016-02-28 NOTE — Discharge Summary (Signed)
Physician Discharge Summary  [name] [id number] DOB: 1927-05-07 DOA: 02/25/2016  PCP: Earney Mallet, MD  Admit date: 02/25/2016 Discharge date: 02/28/2016  Time spent: 35 minutes  Recommendations for Outpatient Follow-up:  1. Repeat BMET to follow electrolytes and renal function  2. Reassess CBC to follow Hgb trend 3. Follow patient improvement or decline in right hip pain.   Discharge Diagnoses:  Principal Problem:   UGIB (upper gastrointestinal bleed) Active Problems:   AKI (acute kidney injury) ([place])   Acute blood loss anemia   Uremia   Fall   Gastroesophageal reflux disease   Benign essential HTN   Discharge Condition: stable and improved. Discharge home with home health services; advise to follow up with PCP in 10 days.  Diet recommendation: heart healthy diet   Filed Weights   02/25/16 0500 02/26/16 0330  Weight: 71 kg (156 lb 8.4 oz) 69.5 kg (153 lb 3.5 oz)    History of present illness:  83 y.o.femalewith medical history significant of R hip pain due to fall and "strain" about 1 week ago. Patient was seen in the ED at [facility], given scripts for percocet and diclofenac and discharged. Patient apparently developed nausea and 4 episodes of black vomit yesterday which prompted her to go back in to ED last night. She also was having AMS. Found with positive FOBT, drop in Hgb, AKI and uremia. High concerns for acute upper GIB.  Hospital Course:  1-UGIB: patient with nausea and coffee ground emesis (4 episodes prior to admission). Recent initiation on Diclofenac and prior hx of GERD. -patient NSAID's discontinued -S/P 1 unit of PRBC on 02/26/16 -S/P EGD on 02/27/16; with findings suggesting esophagitis. No acute bleeding seen -will discharge on PPI BID. -patient will follow up with GI as an outpatient  2-acute blood loss anemia  -will stop NSAID's as mentioned above -S/P 1 unit of PRBC's on 02/26/16 -will follow hemoglobin trend  -Hgb currently 9.1 at  discharge -will discharge on niferex  3- acute encephalopathy -due to uremia and UTI -Resolved now  4-uremia and AKI -appears to be secondary to GIB, low BP, continue use of nephrotoxic agents in wrong setting, use of NSAID's and UTI -significantly improved with IVF's and transfusion of 1 unit of PRBC's -will recommend BMET to follow renal function trend  -minimize/avoid nephrotoxic drugs -given findings for UTI will also treat infection, see below  5-Gout -will resume allopurinol    6-right hip pain: patient with fall a week ago, examined at [facility] and found to have a sprain  -will use PRN pain meds for now -will check CT of her right hip and depending results will discuss case with orthopedic service  -PT/OT ordered and recommending short term rehab vs 24 supervision and assistance. Family decided on home health services. -continue PRN analgesics -avoid NSAID's  7-HTN -patient BP is rising -will continue home antihypertensive regimen -patient advise to follow heart healthy diet   8-Klebsiella Pneumoniae UTI:  -after discussing with patient she endorses increase frequency -given initial confusional state will treat with 5 days of antibiotics -probably UTI also contributing to her AKI -at discharge 4 days of antibiotics pending.  9-hypokalemia: -repleted -will recommend BMET to follow electrolytes trend  Procedures:  See below for x-ray reports  EGD:02/27/16 - No active bleeding or blood in the stomach at the time of this exam. - Normal larynx. - Severe acute and erosive esophagitis consistent with pill-induced esophagitis based on location and character. -Non-bleeding esophageal ulcer at GE junction. -  Non-bleeding pyloric channel ulcers with no stigmata of bleeding - A single gastric polyp.  - Bilious gastric fluid.  Consultations:  GI  Discharge Exam: Vitals:   02/28/16 1326 02/28/16 1407  BP: (!) 139/46   Pulse: (!) 49 63  Resp: 18    Temp: 97.8 F (36.6 C)     General:  AAOX3, reports feeling weak and tired. Patient denies CP and SOB. Endorses still having some intermittent pain in her hip. No further nausea, hematemesis or melena reported.  Cardiovascular: S1 and S2, no rubs, no gallops  Respiratory: good air movement, no wheezing, no crackles   Abdomen: soft, NT, ND positive BS  Musculoskeletal: no edema, no cyanosis, no clubbing, limited ROM due to pain in her right hip; but was able to ambulate with PT.   Discharge Instructions   Discharge Instructions    Diet - low sodium heart healthy    Complete by:  As directed    Discharge instructions    Complete by:  As directed    Take medications as prescribed  Follow up with PCP in 10 days Keep yourself well hydrated  Follow heart healthy diet     Current Discharge Medication List    START taking these medications   Details  ciprofloxacin (CIPRO) 500 MG tablet Take 1 tablet (500 mg total) by mouth 2 (two) times daily. Qty: 8 tablet, Refills: 0    Iron Combinations (NIFEREX) TABS Take 150 mg by mouth daily. Qty: 30 tablet, Refills: 1    traMADol-acetaminophen (ULTRACET) 37.5-325 MG tablet Take 1 tablet by mouth every 8 (eight) hours as needed for severe pain. Qty: 20 tablet, Refills: 0      CONTINUE these medications which have CHANGED   Details  acetaminophen (TYLENOL) 500 MG tablet Take 2 tablets (1,000 mg total) by mouth every 8 (eight) hours as needed for mild pain, fever or headache. Qty: 30 tablet, Refills: 0    pantoprazole (PROTONIX) 40 MG tablet Take 1 tablet (40 mg total) by mouth 2 (two) times daily. Qty: 60 tablet, Refills: 0      CONTINUE these medications which have NOT CHANGED   Details  allopurinol (ZYLOPRIM) 100 MG tablet Take 100 mg by mouth daily. Refills: 4    amLODipine (NORVASC) 5 MG tablet Take 5 mg by mouth daily. Refills: 3    benazepril-hydrochlorthiazide (LOTENSIN HCT) 20-12.5 MG tablet Take 1 tablet by mouth  daily.    Multiple Vitamin (MULTI-VITAMINS) TABS Take 1 tablet by mouth daily.    Polyethyl Glycol-Propyl Glycol (SYSTANE OP) Place 1 drop into both eyes 2 (two) times daily as needed (For dry eyes.).    pregabalin (LYRICA) 25 MG capsule Take 25 mg by mouth 2 (two) times daily.       Allergies  Allergen Reactions  . Aspirin Other (See Comments)    Reaction unknown  . Nsaids Other (See Comments)    Bleeding, skin hemorrhages  . Silver Rash   Follow-up Information    [facility] Follow up.   Why:  physical therapy, occupational therapy Contact information: [place] [phone]        Earney Mallet, MD. Schedule an appointment as soon as possible for a visit in 10 day(s).   Specialty:  Family Medicine Contact information: [place] [address] [phone]           The results of significant diagnostics from this hospitalization (including imaging, microbiology, ancillary and laboratory) are  listed below for reference.    Significant Diagnostic Studies: Ct Hip Right Wo Contrast  Result Date: 02/27/2016 CLINICAL DATA:  Right hip pain secondary to fall EXAM: CT OF THE RIGHT HIP WITHOUT CONTRAST TECHNIQUE: Multidetector CT imaging of the right hip was performed according to the standard protocol. Multiplanar CT image reconstructions were also generated. COMPARISON:  None. FINDINGS: Bones/Joint/Cartilage No fracture or dislocation. Normal alignment. No joint effusion. Mild osteoarthritis of the right hip. Mild osteoarthritis of the right sacroiliac joint. Chondrocalcinosis of the right femoral head as can be seen with CPPD. Degenerative disc disease with disc height loss at L5-S1 with a broad-based disc osteophyte complex partially visualized. Ligaments Ligaments are suboptimally evaluated by CT. Muscles and Tendons No muscle atrophy.  No focal muscle abnormality. Soft tissue No fluid collection or hematoma.  No  soft tissue mass. IMPRESSION: No acute osseous injury of the right hip. Electronically Signed   By: Kathreen Devoid   On: 02/27/2016 12:19    Microbiology: Recent Results (from the past 240 hour(s))  MRSA PCR Screening     Status: Abnormal   Collection Time: 02/25/16  5:02 AM  Result Value Ref Range Status   MRSA by PCR POSITIVE (A) NEGATIVE Final    Comment:        The GeneXpert MRSA Assay (FDA approved for NASAL specimens only), is one component of a comprehensive MRSA colonization surveillance program. It is not intended to diagnose MRSA infection nor to guide or monitor treatment for MRSA infections. RESULT CALLED TO, READ BACK BY AND VERIFIED WITH: BECK,P @ 0911 ON [id number] BY POTEAT,S   Culture, Urine     Status: Abnormal   Collection Time: 02/25/16  2:45 PM  Result Value Ref Range Status   Specimen Description URINE, CATHETERIZED  Final   Special Requests NONE  Final   Culture 80,000 COLONIES/mL KLEBSIELLA PNEUMONIAE (A)  Final   Report Status 02/27/2016 FINAL  Final   Organism ID, Bacteria KLEBSIELLA PNEUMONIAE (A)  Final      Susceptibility   Klebsiella pneumoniae - MIC*    AMPICILLIN RESISTANT Resistant     CEFAZOLIN <=4 SENSITIVE Sensitive     CEFTRIAXONE <=1 SENSITIVE Sensitive     CIPROFLOXACIN <=0.25 SENSITIVE Sensitive     GENTAMICIN <=1 SENSITIVE Sensitive     IMIPENEM 0.5 SENSITIVE Sensitive     NITROFURANTOIN 128 RESISTANT Resistant     TRIMETH/SULFA <=20 SENSITIVE Sensitive     AMPICILLIN/SULBACTAM 4 SENSITIVE Sensitive     PIP/TAZO <=4 SENSITIVE Sensitive     Extended ESBL NEGATIVE Sensitive     * 80,000 COLONIES/mL KLEBSIELLA PNEUMONIAE     Labs: Basic Metabolic Panel:  Recent Labs Lab 02/25/16 0646 02/25/16 1614 02/26/16 0400 02/27/16 0401 02/28/16 0754  NA 141 142 143 143 141  K 4.0 3.9 3.6 3.1* 3.2*  CL 113* 117* 116* 111 110  CO2 21* 19* 21* 24 24  GLUCOSE 112* 101* 100* 110* 113*  BUN 85* 61* 42* 24* 14  CREATININE 1.59* 1.35* 1.11*  1.05* 1.07*  CALCIUM 8.2* 8.3* 8.4* 8.6* 8.5*  MG  --   --   --   --  1.7   Liver Function Tests:  Recent Labs Lab 02/25/16 0646  AST 20  ALT 26  ALKPHOS 66  BILITOT 0.5  PROT 5.1*  ALBUMIN 3.0*   CBC:  Recent Labs Lab 02/25/16 0646 02/25/16 1614 02/26/16 0400 02/27/16 0401 02/28/16 0754  WBC 11.0* 9.8 7.7 9.1 7.1  NEUTROABS  --   --  5.3 6.4 4.4  HGB 7.4* 7.1* 7.4* 9.2* 9.1*  HCT 22.0* 20.9* 22.1* 26.4* 26.6*  MCV 91.3 92.9 94.4 90.7 91.7  PLT 219 205 215 218 228    Signed:  Barton Dubois MD.  [facility] 02/28/2016, 4:18 PM

## 2016-02-28 NOTE — Progress Notes (Signed)
Foley pulled with urine output within 1 hour. Discharge instructions gone over with patient and daughter with all questions asked.

## 2016-02-28 NOTE — Progress Notes (Addendum)
Discharge planning, spoke with patient and daughter at beside, they decline SNF, daughter wishes to take patient home and she will care for her. Patient in agreement with plan. [place]ose [facility] [place] for [facility] services, contacted [facility] for referral. Has RW and 3-n-1. Awaiting final orders to fax to [facility] at [phone]. [phone]

## 2016-02-28 NOTE — Progress Notes (Signed)
Physical Therapy Treatment Patient Details Name: [name] MRN: [id number] DOB: [birth year] Today's Date: 02/28/2016    History of Present Illness [name] is a 83 y.o. female with medical history significant of R hip pain due to fall and "strain" about 1 week ago.  Patient was seen in the ED at [facility], given scripts for percocet and diclofenac and discharged.  Patient apparently developed nausea and 4 episodes of black vomity which prompted her to go back in to ED 02/25/16 .  She also was having AMS..  EGD performed 02/27/16    PT Comments    Pt very agreeable to mobilize as she will be discharging home later today. Pt states she hasn't been out of bed today and feels stiff.  Daughter present and assisted with mobility.  Follow Up Recommendations  Supervision/Assistance - 24 hour;Home health PT (daughter declines SNF, plans to take pt home)     Equipment Recommendations  Rolling walker with 5" wheels;3in1 (PT)    Recommendations for Other Services       Precautions / Restrictions Precautions Precautions: Fall Precaution Comments: hx L foot drop    Mobility  Bed Mobility Overal bed mobility: Needs Assistance Bed Mobility: Supine to Sit     Supine to sit: Min guard     General bed mobility comments: provided bil hands for pt to self assist trunk upright  Transfers Overall transfer level: Needs assistance Equipment used: Rolling walker (2 wheeled) Transfers: Sit to/from Stand Sit to Stand: Min assist;+2 safety/equipment         General transfer comment: assist to rise and steady, daughter present and provided assist at pt's other side  Ambulation/Gait Ambulation/Gait assistance: Min assist;+2 safety/equipment Ambulation Distance (Feet): 35 Feet Assistive device: Rolling walker (2 wheeled) Gait Pattern/deviations: Step-to pattern     General Gait Details: pt reports occasional hip pain however tolerated short distance well   Stairs             Wheelchair Mobility    Modified Rankin (Stroke Patients Only)       Balance                                    Cognition Arousal/Alertness: Awake/alert Behavior During Therapy: Anxious Overall Cognitive Status: Within Functional Limits for tasks assessed                 General Comments: daughter present and assisted with keeping pt on task    Exercises      General Comments        Pertinent Vitals/Pain Pain Assessment: No/denies pain  No c/o dizziness with mobility today.    Home Living                      Prior Function            PT Goals (current goals can now be found in the care plan section) Progress towards PT goals: Progressing toward goals    Frequency    Min 3X/week      PT Plan Current plan remains appropriate    Co-evaluation             End of Session   Activity Tolerance: Patient tolerated treatment well Patient left: in chair;with call bell/phone within reach;with family/visitor present     Time: [id number] PT Time Calculation (min) (ACUTE ONLY): 22 min  Charges:  $Gait  Training: 8-22 mins                    G Codes:      Aloura Matsuoka,KATHrine E 2016/03/12, 4:04 PM Carmelia Bake, PT, DPT 2016-03-12 Pager: [phone]

## 2016-02-28 NOTE — Progress Notes (Signed)
Doesn't have much of an appetite but appears comfortable and in good spirits in bed.  Per RN, pt was confused earlier this morning but is now better.  NAD.  Labile BP.   Hgb stable over past 24 hrs s/p transfusion of 1 unit of prc's.  IMPR:  Stable from GI standpoint.  RECOMM:  1. Ok from GI standpoint to go home at any time 2. I have made appt for pt to f/u w/ me in the office 4 days from now (Feb 13, 10 am) and have given this information to pt w/ my card, and also have spoke w/ daughter about it on telephone. 3. Would dischg on bid PPI (was on qd PTA).   4. I don't think pt will need sucralfate upon dischg 5. If possible would avoid NSAID's for the time being; eventually, she might be able to resume them in low doses if she continues bid PPI 6.  Will sign off; please call if questions or if we can be of further help w/ this pt.  Cleotis Nipper, M.D. Pager [phone] If no answer or after 5 PM call [phone]

## 2018-09-15 ENCOUNTER — Emergency Department (HOSPITAL_COMMUNITY): Payer: Medicare Other

## 2018-09-15 ENCOUNTER — Encounter (HOSPITAL_COMMUNITY): Payer: Self-pay | Admitting: Student

## 2018-09-15 ENCOUNTER — Inpatient Hospital Stay (HOSPITAL_COMMUNITY)
Admission: EM | Admit: 2018-09-15 | Discharge: 2018-09-21 | DRG: 377 | Disposition: A | Discharge status: Home / Self Care | Payer: Medicare Other | Attending: Internal Medicine | Admitting: Internal Medicine

## 2018-09-15 ENCOUNTER — Inpatient Hospital Stay (HOSPITAL_COMMUNITY): Payer: Medicare Other

## 2018-09-15 ENCOUNTER — Other Ambulatory Visit: Payer: Self-pay

## 2018-09-15 DIAGNOSIS — N183 Chronic kidney disease, stage 3 (moderate): Secondary | ICD-10-CM | POA: Diagnosis present

## 2018-09-15 DIAGNOSIS — J690 Pneumonitis due to inhalation of food and vomit: Secondary | ICD-10-CM

## 2018-09-15 DIAGNOSIS — K922 Gastrointestinal hemorrhage, unspecified: Secondary | ICD-10-CM | POA: Diagnosis not present

## 2018-09-15 DIAGNOSIS — G9341 Metabolic encephalopathy: Secondary | ICD-10-CM | POA: Diagnosis not present

## 2018-09-15 DIAGNOSIS — I959 Hypotension, unspecified: Secondary | ICD-10-CM | POA: Diagnosis present

## 2018-09-15 DIAGNOSIS — G629 Polyneuropathy, unspecified: Secondary | ICD-10-CM | POA: Diagnosis present

## 2018-09-15 DIAGNOSIS — Z9109 Other allergy status, other than to drugs and biological substances: Secondary | ICD-10-CM

## 2018-09-15 DIAGNOSIS — I10 Essential (primary) hypertension: Secondary | ICD-10-CM | POA: Diagnosis present

## 2018-09-15 DIAGNOSIS — K2971 Gastritis, unspecified, with bleeding: Principal | ICD-10-CM | POA: Diagnosis present

## 2018-09-15 DIAGNOSIS — K317 Polyp of stomach and duodenum: Secondary | ICD-10-CM | POA: Diagnosis present

## 2018-09-15 DIAGNOSIS — K810 Acute cholecystitis: Secondary | ICD-10-CM | POA: Diagnosis present

## 2018-09-15 DIAGNOSIS — H81399 Other peripheral vertigo, unspecified ear: Secondary | ICD-10-CM | POA: Diagnosis not present

## 2018-09-15 DIAGNOSIS — I480 Paroxysmal atrial fibrillation: Secondary | ICD-10-CM | POA: Diagnosis not present

## 2018-09-15 DIAGNOSIS — R7989 Other specified abnormal findings of blood chemistry: Secondary | ICD-10-CM

## 2018-09-15 DIAGNOSIS — Z96642 Presence of left artificial hip joint: Secondary | ICD-10-CM | POA: Diagnosis present

## 2018-09-15 DIAGNOSIS — K219 Gastro-esophageal reflux disease without esophagitis: Secondary | ICD-10-CM | POA: Diagnosis present

## 2018-09-15 DIAGNOSIS — K8000 Calculus of gallbladder with acute cholecystitis without obstruction: Secondary | ICD-10-CM | POA: Diagnosis present

## 2018-09-15 DIAGNOSIS — I129 Hypertensive chronic kidney disease with stage 1 through stage 4 chronic kidney disease, or unspecified chronic kidney disease: Secondary | ICD-10-CM | POA: Diagnosis present

## 2018-09-15 DIAGNOSIS — Z20828 Contact with and (suspected) exposure to other viral communicable diseases: Secondary | ICD-10-CM | POA: Diagnosis present

## 2018-09-15 DIAGNOSIS — Z79899 Other long term (current) drug therapy: Secondary | ICD-10-CM

## 2018-09-15 DIAGNOSIS — R509 Fever, unspecified: Secondary | ICD-10-CM

## 2018-09-15 DIAGNOSIS — R740 Nonspecific elevation of levels of transaminase and lactic acid dehydrogenase [LDH]: Secondary | ICD-10-CM | POA: Diagnosis not present

## 2018-09-15 DIAGNOSIS — Z886 Allergy status to analgesic agent status: Secondary | ICD-10-CM

## 2018-09-15 DIAGNOSIS — Z8711 Personal history of peptic ulcer disease: Secondary | ICD-10-CM

## 2018-09-15 DIAGNOSIS — Z8261 Family history of arthritis: Secondary | ICD-10-CM | POA: Diagnosis not present

## 2018-09-15 DIAGNOSIS — Z833 Family history of diabetes mellitus: Secondary | ICD-10-CM | POA: Diagnosis not present

## 2018-09-15 DIAGNOSIS — K2211 Ulcer of esophagus with bleeding: Secondary | ICD-10-CM | POA: Diagnosis present

## 2018-09-15 DIAGNOSIS — R7401 Elevation of levels of liver transaminase levels: Secondary | ICD-10-CM | POA: Diagnosis present

## 2018-09-15 DIAGNOSIS — K819 Cholecystitis, unspecified: Secondary | ICD-10-CM

## 2018-09-15 DIAGNOSIS — N179 Acute kidney failure, unspecified: Secondary | ICD-10-CM | POA: Diagnosis present

## 2018-09-15 DIAGNOSIS — F0390 Unspecified dementia without behavioral disturbance: Secondary | ICD-10-CM | POA: Diagnosis present

## 2018-09-15 DIAGNOSIS — M109 Gout, unspecified: Secondary | ICD-10-CM | POA: Diagnosis present

## 2018-09-15 DIAGNOSIS — M1990 Unspecified osteoarthritis, unspecified site: Secondary | ICD-10-CM | POA: Diagnosis present

## 2018-09-15 DIAGNOSIS — E860 Dehydration: Secondary | ICD-10-CM | POA: Diagnosis present

## 2018-09-15 LAB — CBC WITH DIFFERENTIAL/PLATELET
Abs Immature Granulocytes: 0.16 10*3/uL — ABNORMAL HIGH (ref 0.00–0.07)
Basophils Absolute: 0 10*3/uL (ref 0.0–0.1)
Basophils Relative: 0 %
Eosinophils Absolute: 0 10*3/uL (ref 0.0–0.5)
Eosinophils Relative: 0 %
HCT: 35.1 % — ABNORMAL LOW (ref 36.0–46.0)
Hemoglobin: 11.6 g/dL — ABNORMAL LOW (ref 12.0–15.0)
Immature Granulocytes: 1 %
Lymphocytes Relative: 5 %
Lymphs Abs: 1 10*3/uL (ref 0.7–4.0)
MCH: 31 pg (ref 26.0–34.0)
MCHC: 33 g/dL (ref 30.0–36.0)
MCV: 93.9 fL (ref 80.0–100.0)
Monocytes Absolute: 0.9 10*3/uL (ref 0.1–1.0)
Monocytes Relative: 5 %
Neutro Abs: 16.2 10*3/uL — ABNORMAL HIGH (ref 1.7–7.7)
Neutrophils Relative %: 89 %
Platelets: 286 10*3/uL (ref 150–400)
RBC: 3.74 MIL/uL — ABNORMAL LOW (ref 3.87–5.11)
RDW: 13.3 % (ref 11.5–15.5)
WBC: 18.2 10*3/uL — ABNORMAL HIGH (ref 4.0–10.5)
nRBC: 0 % (ref 0.0–0.2)

## 2018-09-15 LAB — COMPREHENSIVE METABOLIC PANEL
ALT: 130 U/L — ABNORMAL HIGH (ref 0–44)
AST: 177 U/L — ABNORMAL HIGH (ref 15–41)
Albumin: 3 g/dL — ABNORMAL LOW (ref 3.5–5.0)
Alkaline Phosphatase: 271 U/L — ABNORMAL HIGH (ref 38–126)
Anion gap: 10 (ref 5–15)
BUN: 35 mg/dL — ABNORMAL HIGH (ref 8–23)
CO2: 22 mmol/L (ref 22–32)
Calcium: 8.5 mg/dL — ABNORMAL LOW (ref 8.9–10.3)
Chloride: 108 mmol/L (ref 98–111)
Creatinine, Ser: 2.06 mg/dL — ABNORMAL HIGH (ref 0.44–1.00)
GFR calc Af Amer: 24 mL/min — ABNORMAL LOW (ref >60)
GFR calc non Af Amer: 21 mL/min — ABNORMAL LOW (ref >60)
Glucose, Bld: 155 mg/dL — ABNORMAL HIGH (ref 70–99)
Potassium: 3.4 mmol/L — ABNORMAL LOW (ref 3.5–5.1)
Sodium: 140 mmol/L (ref 135–145)
Total Bilirubin: 5.7 mg/dL — ABNORMAL HIGH (ref 0.3–1.2)
Total Protein: 6.7 g/dL (ref 6.5–8.1)

## 2018-09-15 LAB — PROTIME-INR
INR: 1.1 (ref 0.8–1.2)
Prothrombin Time: 14.4 seconds (ref 11.4–15.2)

## 2018-09-15 LAB — SARS CORONAVIRUS 2 BY RT PCR (HOSPITAL ORDER, PERFORMED IN ~~LOC~~ HOSPITAL LAB): SARS Coronavirus 2: NEGATIVE

## 2018-09-15 LAB — LACTIC ACID, PLASMA: Lactic Acid, Venous: 1.1 mmol/L (ref 0.5–1.9)

## 2018-09-15 LAB — TYPE AND SCREEN
ABO/RH(D): O POS
Antibody Screen: NEGATIVE

## 2018-09-15 LAB — POC OCCULT BLOOD, ED: Fecal Occult Bld: POSITIVE — AB

## 2018-09-15 LAB — LIPASE, BLOOD: Lipase: 27 U/L (ref 11–51)

## 2018-09-15 LAB — MRSA PCR SCREENING: MRSA by PCR: NEGATIVE

## 2018-09-15 MED ORDER — ACETAMINOPHEN 325 MG PO TABS: 650.0000 mg | ORAL_TABLET | Freq: Four times a day (QID) | ORAL | Status: DC | PRN

## 2018-09-15 MED ORDER — ACETAMINOPHEN 650 MG RE SUPP: 650.0000 mg | Freq: Four times a day (QID) | RECTAL | Status: DC | PRN

## 2018-09-15 MED ORDER — SODIUM CHLORIDE 0.9% FLUSH
3.0000 mL | Freq: Two times a day (BID) | INTRAVENOUS | Status: DC
  Administered 2018-09-16 – 2018-09-21 (×7): 3 mL via INTRAVENOUS

## 2018-09-15 MED ORDER — SODIUM CHLORIDE 0.9 % IV BOLUS
1000.0000 mL | Freq: Once | INTRAVENOUS | Status: AC
  Administered 2018-09-15: 1000 mL via INTRAVENOUS

## 2018-09-15 MED ORDER — PIPERACILLIN-TAZOBACTAM 3.375 G IVPB
3.3750 g | Freq: Three times a day (TID) | INTRAVENOUS | Status: DC
  Administered 2018-09-16 – 2018-09-19 (×10): 3.375 g via INTRAVENOUS
  Filled 2018-09-15 (×10): qty 50

## 2018-09-15 MED ORDER — MIRABEGRON ER 25 MG PO TB24
50.0000 mg | ORAL_TABLET | Freq: Every day | ORAL | Status: DC
  Administered 2018-09-16 – 2018-09-21 (×5): 50 mg via ORAL
  Filled 2018-09-15 (×7): qty 2

## 2018-09-15 MED ORDER — SODIUM CHLORIDE 0.9 % IV BOLUS
1000.0000 mL | Freq: Once | INTRAVENOUS | Status: AC
  Administered 2018-09-15: 17:00:00 1000 mL via INTRAVENOUS

## 2018-09-15 MED ORDER — SODIUM CHLORIDE 0.9 % IV SOLN
8.0000 mg/h | INTRAVENOUS | Status: DC
  Administered 2018-09-15 – 2018-09-17 (×5): 8 mg/h via INTRAVENOUS
  Filled 2018-09-15 (×5): qty 80

## 2018-09-15 MED ORDER — ONDANSETRON HCL 4 MG/2ML IJ SOLN: 4.0000 mg | Freq: Four times a day (QID) | INTRAMUSCULAR | Status: DC | PRN

## 2018-09-15 MED ORDER — ONDANSETRON HCL 4 MG/2ML IJ SOLN
4.0000 mg | Freq: Once | INTRAMUSCULAR | Status: AC
  Administered 2018-09-15: 4 mg via INTRAVENOUS
  Filled 2018-09-15: qty 2

## 2018-09-15 MED ORDER — CHLORHEXIDINE GLUCONATE CLOTH 2 % EX PADS
6.0000 | MEDICATED_PAD | Freq: Every day | CUTANEOUS | Status: DC
  Administered 2018-09-15 – 2018-09-19 (×3): 6 via TOPICAL

## 2018-09-15 MED ORDER — ONDANSETRON HCL 4 MG PO TABS: 4.0000 mg | ORAL_TABLET | Freq: Four times a day (QID) | ORAL | Status: DC | PRN

## 2018-09-15 MED ORDER — SODIUM CHLORIDE 0.9 % IV SOLN
80.0000 mg | Freq: Once | INTRAVENOUS | Status: AC
  Administered 2018-09-15: 80 mg via INTRAVENOUS
  Filled 2018-09-15: qty 80

## 2018-09-15 MED ORDER — SODIUM CHLORIDE 0.9 % IV SOLN
INTRAVENOUS | Status: DC
  Administered 2018-09-15: 22:00:00 via INTRAVENOUS

## 2018-09-15 MED ORDER — PIPERACILLIN-TAZOBACTAM 3.375 G IVPB 30 MIN
3.3750 g | Freq: Once | INTRAVENOUS | Status: AC
  Administered 2018-09-15: 3.375 g via INTRAVENOUS
  Filled 2018-09-15: qty 50

## 2018-09-15 NOTE — ED Triage Notes (Signed)
Patient's daughter reports that the patient vomited dark emesis a week ago and again this AM.

## 2018-09-15 NOTE — ED Provider Notes (Signed)
[facility] DEPT Provider Note   CSN: [id number] Arrival date & time: 09/15/18  1242     History   Chief Complaint Chief Complaint  Patient presents with   Hematemesis   Hypotension    HPI [name] is a 85 y.o. female with a history of GERD, hypertension, prior upper GI bleed, & prior acute blood loss anemia who presents to the ED w/ complaints of hematemesis this AM. Per patient's daughter patient had an episodes of hematemesis described as vomiting black content 1 week prior with some RUQ abdominal pain, saw PCP- blood work was re-assuring & scheduled for outpatient [place] which was not completed due to scheduling issue. Ultimately was doing better until this AM when patient had an additional episode of hematemesis described as an extremely large volume of black emesis content. Has not completed of pain today. No alleviating/aggravating factors. Her daughter has not noted any blood in her stool per say. Her daughter provides majority of history secondary to patient's dementia. Level 5 caveat applies. She does not drink EtOH or take NSAIDs/anti-coagulants.      HPI  Past Medical History:  Diagnosis Date   Arthritis    Back pain    Cancer (HCC)    GERD (gastroesophageal reflux disease)    Gout    Gout    History of left foot drop    HTN (hypertension)    Neuropathy (HCC)    Osteoarthritis    Pneumonia    Renal insufficiency     Patient Active Problem List   Diagnosis Date Noted   Fall    Gastroesophageal reflux disease    Benign essential HTN    UGIB (upper gastrointestinal bleed) 02/25/2016   AKI (acute kidney injury) ([place]) 02/25/2016   Acute blood loss anemia 02/25/2016   Uremia 02/25/2016    Past Surgical History:  Procedure Laterality Date   ABDOMINAL HYSTERECTOMY     CATARACT EXTRACTION, BILATERAL     EPIDURAL STEROID INJECTION     ESOPHAGOGASTRODUODENOSCOPY (EGD) WITH PROPOFOL N/A 02/27/2016   Procedure: ESOPHAGOGASTRODUODENOSCOPY (EGD) WITH PROPOFOL;  Surgeon: Ronald Lobo, MD;  Location: [facility];  Service: Endoscopy;  Laterality: N/A;   MOHS SURGERY     right carpal tunnel release     TONSILLECTOMY     TOTAL HIP ARTHROPLASTY       OB History   No obstetric history on file.      Home Medications    Prior to Admission medications   Medication Sig Start Date End Date Taking? Authorizing Provider  acetaminophen (TYLENOL) 500 MG tablet Take 2 tablets (1,000 mg total) by mouth every 8 (eight) hours as needed for mild pain, fever or headache. 02/28/16   Barton Dubois, MD  allopurinol (ZYLOPRIM) 100 MG tablet Take 100 mg by mouth daily. 01/24/16   [provider]  amLODipine (NORVASC) 5 MG tablet Take 5 mg by mouth daily. 02/10/16   [provider]  benazepril-hydrochlorthiazide (LOTENSIN HCT) 20-12.5 MG tablet Take 1 tablet by mouth daily. 04/30/14   [provider]  Iron Combinations (NIFEREX) TABS Take 150 mg by mouth daily. 02/28/16   Barton Dubois, MD  Multiple Vitamin (MULTI-VITAMINS) TABS Take 1 tablet by mouth daily. 03/03/07   [provider]  pantoprazole (PROTONIX) 40 MG tablet Take 1 tablet (40 mg total) by mouth 2 (two) times daily. 02/28/16   Barton Dubois, MD  Polyethyl Glycol-Propyl Glycol (SYSTANE OP) Place 1 drop into both eyes 2 (two) times daily as  needed (For dry eyes.).    [provider]  pregabalin (LYRICA) 25 MG capsule Take 25 mg by mouth 2 (two) times daily.    [provider]  traMADol-acetaminophen (ULTRACET) 37.5-325 MG tablet Take 1 tablet by mouth every 8 (eight) hours as needed for severe pain. 02/28/16   Barton Dubois, MD    Family History Family History  Problem Relation Age of Onset   Rheumatologic disease Mother    Diabetes Mother     Social History Social History   Tobacco Use   Smoking status: Never Smoker   Smokeless tobacco: Never Used  Substance Use Topics   Alcohol  use: No   Drug use: No     Allergies   Aspirin, Nsaids, and Silver   Review of Systems Review of Systems  Unable to perform ROS: Dementia     Physical Exam Updated Vital Signs BP (!) 125/52    Pulse (!) 102    Temp 98.4 F (36.9 C) (Oral)    Resp 20    Ht 5' 5"  (1.651 m)    Wt 73.5 kg    SpO2 97%    BMI 26.96 kg/m   Physical Exam Vitals signs and nursing note reviewed. Exam conducted with a chaperone present.  Constitutional:      General: She is not in acute distress.    Appearance: She is well-developed. She is not toxic-appearing.  HENT:     Head: Normocephalic and atraumatic.  Eyes:     General:        Right eye: No discharge.        Left eye: No discharge.     Conjunctiva/sclera: Conjunctivae normal.  Neck:     Musculoskeletal: Neck supple.  Cardiovascular:     Rate and Rhythm: Regular rhythm. Tachycardia present.  Pulmonary:     Effort: Pulmonary effort is normal. No respiratory distress.     Breath sounds: Normal breath sounds. No wheezing, rhonchi or rales.  Abdominal:     General: There is no distension.     Palpations: Abdomen is soft.     Tenderness: There is abdominal tenderness (RUQ). There is no guarding or rebound.  Genitourinary:    Rectum: Guaiac result positive. External hemorrhoid present.     Comments: DRE: Dark brown stool. No bright red blood or melena.  Skin:    General: Skin is warm and dry.     Findings: No rash.  Neurological:     Mental Status: She is alert.     Comments: Clear speech.   Psychiatric:        Behavior: Behavior normal.      ED Treatments / Results  Labs (all labs ordered are listed, but only abnormal results are displayed) Labs Reviewed  COMPREHENSIVE METABOLIC PANEL - Abnormal; Notable for the following components:      Result Value   Potassium 3.4 (*)    Glucose, Bld 155 (*)    BUN 35 (*)    Creatinine, Ser 2.06 (*)    Calcium 8.5 (*)    Albumin 3.0 (*)    AST 177 (*)    ALT 130 (*)    Alkaline  Phosphatase 271 (*)    Total Bilirubin 5.7 (*)    GFR calc non Af Amer 21 (*)    GFR calc Af Amer 24 (*)    All other components within normal limits  CBC WITH DIFFERENTIAL/PLATELET - Abnormal; Notable for the following components:   WBC 18.2 (*)  RBC 3.74 (*)    Hemoglobin 11.6 (*)    HCT 35.1 (*)    Neutro Abs 16.2 (*)    Abs Immature Granulocytes 0.16 (*)    All other components within normal limits  POC OCCULT BLOOD, ED - Abnormal; Notable for the following components:   Fecal Occult Bld POSITIVE (*)    All other components within normal limits  CULTURE, BLOOD (ROUTINE X 2)  CULTURE, BLOOD (ROUTINE X 2)  SARS CORONAVIRUS 2 (HOSPITAL ORDER, [facility])  PROTIME-INR  LIPASE, BLOOD  LACTIC ACID, PLASMA  LACTIC ACID, PLASMA  TYPE AND SCREEN    EKG None  Radiology US Abdomen Limited Ruq  Result Date: 09/15/2018 CLINICAL DATA:  85 year old female with elevated LFTs. EXAM: ULTRASOUND ABDOMEN LIMITED RIGHT UPPER QUADRANT COMPARISON:  None. FINDINGS: Gallbladder: Multiple mobile gallstones are identified, the largest measuring 1.1 cm. Gallbladder wall thickening is present. There is no evidence of pericholecystic fluid or sonographic Murphy sign. Apparent gallbladder wall calcifications noted. Common bile duct: Diameter: 3.3 mm. No evidence of intrahepatic or extrahepatic biliary dilatation. Liver: Increased hepatic echogenicity noted. No focal hepatic abnormalities are noted. Portal vein is patent on color Doppler imaging with normal direction of blood flow towards the liver. Other: None. IMPRESSION: 1. Cholelithiasis with gallbladder wall thickening but without pericholecystic fluid or sonographic Murphy sign - equivocal for acute cholecystitis. 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 2. No evidence of biliary dilatation. 3. Hepatic steatosis Electronically Signed   By: Margarette Canada M.D.    On: 09/15/2018 15:40    Procedures .Critical Care Performed by: Amaryllis Dyke, PA-C Authorized by: Amaryllis Dyke, PA-C     (including critical care time)  CRITICAL CARE Performed by: Kennith Maes   Total critical care time: 40 minutes  Critical care time was exclusive of separately billable procedures and treating other patients.  Critical care was necessary to treat or prevent imminent or life-threatening deterioration.  Critical care was time spent personally by me on the following activities: development of treatment plan with patient and/or surrogate as well as nursing, discussions with consultants, evaluation of patient's response to treatment, examination of patient, obtaining history from patient or surrogate, ordering and performing treatments and interventions, ordering and review of laboratory studies, ordering and review of radiographic studies, pulse oximetry and re-evaluation of patient's condition.   Medications Ordered in ED Medications  pantoprazole (PROTONIX) 80 mg in sodium chloride 0.9 % 250 mL (0.32 mg/mL) infusion (8 mg/hr Intravenous New Bag/Given 09/15/18 1348)  sodium chloride 0.9 % bolus 1,000 mL (has no administration in time range)  piperacillin-tazobactam (ZOSYN) IVPB 3.375 g (has no administration in time range)  sodium chloride 0.9 % bolus 1,000 mL (1,000 mLs Intravenous New Bag/Given 09/15/18 1302)  pantoprazole (PROTONIX) 80 mg in sodium chloride 0.9 % 100 mL IVPB (80 mg Intravenous New Bag/Given 09/15/18 1348)  ondansetron (ZOFRAN) injection 4 mg (4 mg Intravenous Given 09/15/18 1326)    Prior records reviewed:  Upper endoscopy 02/27/2016:  Impression:  -No active bleeding or blood in the stomach at the time of the exam -Normal larynx -Severe acute erosive esophagitis consistent with pill induced esophagitis based on location and character -Nonbleeding esophageal ulcer at GE junction -Nonbleeding pyloric channel ulcers  with no stigmata of bleeding -A single gastric polyp - Bilious gastric fluid -No specimens collected   Initial Impression / Assessment and Plan / ED Course  I have reviewed the triage vital  signs and the nursing notes.  Pertinent labs & imaging results that were available during my care of the patient were reviewed by me and considered in my medical decision making (see chart for details).   Patient presents to the ED with her daughter for hematemesis.  She is nontoxic appearing, mildly tachycardic, pressures are soft 99/45 on my evaluation. Exam w/ RUQ abdominal tenderness, but no peritoneal signs. DRE w/ dark brown stool, but no bright red blood or melena. Prior upper GI bleed on chart review. Will administer 1L NS with protonix & zofran.   CBC: Hgb/hct 11.6/35.1- does not appear to require emergent transfusion. Leukocytosis @ 18.2  CMP: AKI w/ creatinine/BUN elevation (creatinine doubled from prior on record)- fluids running. LFTs, alk phos, T. Bili elevated.  Fecal occult: Positive  PT/INR: WNL Lipase: WNL  Leukocytosis with elevated LFTs, tachycardia, & borderline pressure- will also obtain blood cultures, lactic acid, & check RUQ [place]. Additional 1L of fluid ordered. Dr. Wilson Singer in agreement. I personally repeated patient's temperature orally 99.6.  Will start abx as well, concern for cholecystitis therefore zosyn selected.    RUQ [place]: 1. Cholelithiasis with gallbladder wall thickening but without pericholecystic fluid or sonographic Murphy sign - equivocal for acute cholecystitis. 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 2. No evidence of biliary dilatation. 3. Hepatic steatosis   Last PO intake last night, nothing to eat/drink today.  Will consult general surgery    16:05: CONSULT: Discussed with Alferd Apa PA-C w/ general surgery-we will discuss with surgeon on-call, patient will likely need additional  imaging given his elevated LFTs, he will place orders for imaging after discussion w/ attending  and call back for further discussion.  Ultimately in agreement with GI consult and hospitalist admission.  16:24: CONSULT: Discussed with hospitalist Dr. Posey Pronto- accepts admission.   16:39: CONSULT: Re-discussed w/ Maczis PA-C- plan for CT wo which has been ordered- further management based on these results as well as collaboration among consultatns.   17:05: CONSULT: Discussed w/ gastroenteroligst Dr. Oletta Lamas w/ [facility]- will consult.   SHANYA FERRISS was evaluated in Emergency Department on 09/15/2018 for the symptoms described in the history of present illness. He/she was evaluated in the context of the global COVID-19 pandemic, which necessitated consideration that the patient might be at risk for infection with the SARS-CoV-2 virus that causes COVID-19. Institutional protocols and algorithms that pertain to the evaluation of patients at risk for COVID-19 are in a state of rapid change based on information released by regulatory bodies including the CDC and federal and state organizations. These policies and algorithms were followed during the patient's care in the ED.   This is a shared visit with supervising physician Dr. Wilson Singer who has independently evaluated patient & provided guidance in evaluation/management/disposition, in agreement with care    Final Clinical Impressions(s) / ED Diagnoses   Final diagnoses:  LFT elevation  Gastrointestinal hemorrhage, unspecified gastrointestinal hemorrhage type  Cholecystitis    ED Discharge Orders    None       Amaryllis Dyke, PA-C 09/15/18 1710    Maudie Flakes, MD 09/22/18 [phone]

## 2018-09-15 NOTE — H&P (Signed)
[facility] History and Physical   Patient: [name] [id number]   PCP: Earney Mallet, MD DOB: 1927-09-20   DOA: 09/15/2018   DOS: 09/15/2018   DOS: the patient was seen and examined on 09/15/2018  Patient coming from: The patient is coming from Home  Chief Complaint: Vomiting of blood  HPI: [name] is a 85 y.o. female with Past medical history of HTN, CKD, prior GI bleed. Patient presents with complaints of abdominal pain and nausea and vomiting.  Patient is unable to provide much history. Reports that she had an episode of coffee-ground emesis 1 week ago. Today when the patient woke up she was at her baseline. Later in the afternoon she started having coffee-ground vomiting multiple episodes. She was brought to the hospital. In the hospital the patient was found hypotensive. Patient also reported to have some black-colored stool. No NSAID.  No anticoagulation.  No antiplatelet medications. Patient had a similar presentation in the past.  At that time she was found to be having pill induced gastritis.  ED Course: Patient presents with hypertension.  Elevated LFTs.  Tachycardic.  Leukocytosis.  Positive Hemoccult. Right upper quadrant ultrasound was concerning for cholecystitis and therefore general surgery was consulted.  Patient was referred for admission.  At her baseline ambulates with support And is dependent for most of her ADL; does not manages her medication on her own.  Review of Systems: as mentioned in the history of present illness.  All other systems reviewed and are negative.  Past Medical History:  Diagnosis Date  . Arthritis   . Back pain   . Cancer ([place])   . GERD (gastroesophageal reflux disease)   . Gout   . Gout   . History of left foot drop   . HTN (hypertension)   . Neuropathy   . Osteoarthritis   . Pneumonia   . Renal insufficiency    Past Surgical History:  Procedure Laterality Date  . ABDOMINAL HYSTERECTOMY    . CATARACT  EXTRACTION, BILATERAL    . EPIDURAL STEROID INJECTION    . ESOPHAGOGASTRODUODENOSCOPY (EGD) WITH PROPOFOL N/A 02/27/2016   Procedure: ESOPHAGOGASTRODUODENOSCOPY (EGD) WITH PROPOFOL;  Surgeon: Ronald Lobo, MD;  Location: [facility];  Service: Endoscopy;  Laterality: N/A;  . MOHS SURGERY    . right carpal tunnel release    . TONSILLECTOMY    . TOTAL HIP ARTHROPLASTY     Social History:  reports that she has never smoked. She has never used smokeless tobacco. She reports that she does not drink alcohol or use drugs.  Allergies  Allergen Reactions  . Aspirin Other (See Comments)    Reaction unknown  . Nsaids Other (See Comments)    Bleeding, skin hemorrhages  . Silver Rash    Family History  Problem Relation Age of Onset  . Rheumatologic disease Mother   . Diabetes Mother      Prior to Admission medications   Medication Sig Start Date End Date Taking? Authorizing Provider  amLODipine (NORVASC) 5 MG tablet Take 5 mg by mouth daily. 02/10/16  Yes [provider]  Iron Combinations (NIFEREX) TABS Take 150 mg by mouth daily. 02/28/16  Yes Barton Dubois, MD  mirabegron ER (MYRBETRIQ) 50 MG TB24 tablet Take 50 mg by mouth daily.   Yes [provider]  Multiple Vitamin (MULTI-VITAMINS) TABS Take 1 tablet by mouth daily. 03/03/07  Yes [provider]  pantoprazole (PROTONIX) 40 MG tablet Take 1 tablet (40 mg total) by mouth 2 (  two) times daily. 02/28/16  Yes Barton Dubois, MD  Polyethyl Glycol-Propyl Glycol (SYSTANE OP) Place 1 drop into both eyes 2 (two) times daily as needed (For dry eyes.).   Yes [provider]  pregabalin (LYRICA) 75 MG capsule Take 75 mg by mouth 2 (two) times daily.   Yes [provider]  acetaminophen (TYLENOL) 500 MG tablet Take 2 tablets (1,000 mg total) by mouth every 8 (eight) hours as needed for mild pain, fever or headache. Patient not taking: Reported on 09/15/2018 02/28/16   Barton Dubois, MD  traMADol-acetaminophen  (ULTRACET) 37.5-325 MG tablet Take 1 tablet by mouth every 8 (eight) hours as needed for severe pain. Patient not taking: Reported on 09/15/2018 02/28/16   Barton Dubois, MD    Physical Exam: Vitals:   09/15/18 1730 09/15/18 1800 09/15/18 1830 09/15/18 1900  BP:      Pulse: (!) 101 100 99 (!) 102  Resp: 20 18 20 16   Temp:      TempSrc:      SpO2: 97% 97% 96% 97%  Weight:      Height:        General: Alert, Awake and Oriented to Time, Place and Person. Appear in mild distress, affect appropriate Eyes: PERRL, Conjunctiva normal ENT: Oral Mucosa clear moist. Neck: no JVD, no Abnormal Mass Or lumps Cardiovascular: S1 and S2 Present, no Murmur, Peripheral Pulses Present Respiratory: normal respiratory effort, Bilateral Air entry equal and Decreased, no use of accessory muscle, Clear to Auscultation, no Crackles, no wheezes Abdomen: Bowel Sound present, Soft and mild tenderness, no hernia Skin: no redness, no Rash, no induration Extremities: no Pedal edema, no calf tenderness Neurologic: Grossly no focal neuro deficit. Bilaterally Equal motor strength  Labs on Admission:  CBC: Recent Labs  Lab 09/15/18 1254  WBC 18.2*  NEUTROABS 16.2*  HGB 11.6*  HCT 35.1*  MCV 93.9  PLT [id number]   Basic Metabolic Panel: Recent Labs  Lab 09/15/18 1254  NA 140  K 3.4*  CL 108  CO2 22  GLUCOSE 155*  BUN 35*  CREATININE 2.06*  CALCIUM 8.5*   GFR: Estimated Creatinine Clearance: 19 mL/min (A) (by C-G formula based on SCr of 2.06 mg/dL (H)). Liver Function Tests: Recent Labs  Lab 09/15/18 1254  AST 177*  ALT 130*  ALKPHOS 271*  BILITOT 5.7*  PROT 6.7  ALBUMIN 3.0*   Recent Labs  Lab 09/15/18 1254  LIPASE 27   No results for input(s): AMMONIA in the last 168 hours. Coagulation Profile: Recent Labs  Lab 09/15/18 1254  INR 1.1   Cardiac Enzymes: No results for input(s): CKTOTAL, CKMB, CKMBINDEX, TROPONINI in the last 168 hours. BNP (last 3 results) No results for input(s):  PROBNP in the last 8760 hours. HbA1C: No results for input(s): HGBA1C in the last 72 hours. CBG: No results for input(s): GLUCAP in the last 168 hours. Lipid Profile: No results for input(s): CHOL, HDL, LDLCALC, TRIG, CHOLHDL, LDLDIRECT in the last 72 hours. Thyroid Function Tests: No results for input(s): TSH, T4TOTAL, FREET4, T3FREE, THYROIDAB in the last 72 hours. Anemia Panel: No results for input(s): VITAMINB12, FOLATE, FERRITIN, TIBC, IRON, RETICCTPCT in the last 72 hours. Urine analysis:    Component Value Date/Time   COLORURINE STRAW (A) 02/25/2016 [place] 02/25/2016 1445   LABSPEC 1.010 02/25/2016 1445   PHURINE 5.0 02/25/2016 1445   GLUCOSEU NEGATIVE 02/25/2016 1445   HGBUR SMALL (A) 02/25/2016 1445   BILIRUBINUR NEGATIVE 02/25/2016 1445  KETONESUR NEGATIVE 02/25/2016 1445   PROTEINUR NEGATIVE 02/25/2016 1445   UROBILINOGEN 0.2 02/10/2007 0213   NITRITE NEGATIVE 02/25/2016 1445   LEUKOCYTESUR NEGATIVE 02/25/2016 1445    Radiological Exams on Admission: Ct Abdomen Pelvis Wo Contrast  Result Date: 09/15/2018 CLINICAL DATA:  Cholelithiasis, porcelain gallbladder ultrasound EXAM: CT ABDOMEN AND PELVIS WITHOUT CONTRAST TECHNIQUE: Multidetector CT imaging of the abdomen and pelvis was performed following the standard protocol without IV contrast. COMPARISON:  Same-day ultrasound the right upper quadrant FINDINGS: Lower chest: Bandlike areas of basilar atelectasis and/or scarring. Cardiomegaly. Calcification of the aortic leaflets. Coronary calcifications. Hepatobiliary: No focal liver lesion. The gallbladder is partially decompressed around several calcified gallstones. The wall appears thickened and edematous but without mural calcification. There is pericholecystic fluid in inflammation tracking throughout the gallbladder fossa and towards the tip of the liver. No visible biliary ductal dilatation or calcified intraductal gallstones. Pancreas: Fatty replacement  of the pancreas. No pancreatic ductal dilatation or surrounding inflammatory changes. Spleen: This Normal in size without focal abnormality. Adrenals/Urinary Tract: Kidneys appear somewhat atrophic and diminutive in size. Kidneys are otherwise unremarkable, without renal calculi, suspicious lesion, or hydronephrosis. Bladder is grossly unremarkable though difficult to assess fully given extensive streak artifact in the pelvis. Stomach/Bowel: Distal esophagus stomach and duodenal sweep are unremarkable. No small bowel thickening or dilatation. The appendix is not well visualized as the cecal tip is partially obscured by streak artifact. Mild thickening of the hepatic flexure, likely reactive. Distal colon has a normal appearance. Vascular/Lymphatic: Atherosclerotic plaque within the normal caliber aorta. No suspicious or enlarged lymph nodes in the included lymphatic chains. Reproductive: Uterus is surgically absent. No visible adnexal lesions though the pelvis is largely obscured by streak artifact. Other: No abdominopelvic free air. Reactive free fluid tracking along the attic tip and towards the right paracolic gutter likely tracking from the gallbladder fossa. No bowel containing hernias. Musculoskeletal: Multilevel degenerative changes are present in the imaged portions of the spine. No acute osseous abnormality or suspicious osseous lesion. Total left hip arthroplasty and plate and screw construct of the left ilium results in extensive streak artifact which limits details of the adjacent osseous structures and soft tissues of the pelvis. IMPRESSION: 1. Cholelithiasis with gallbladder wall thickening and pericholecystic fluid tracking throughout the gallbladder fossa and towards the tip of the liver, consistent with acute cholecystitis. 2. No mural calcification of the gallbladder. 3. Mild thickening of the hepatic flexure, likely reactive. 4. Evaluation of the pelvis limited by extensive streak artifact from  patient's left hip arthroplasty and iliac plate screw construct. 5. Aortic Atherosclerosis (ICD10-I70.0). Electronically Signed   By: Lovena Le M.D.   On: 09/15/2018 18:13   Dg Chest Port 1 View  Result Date: 09/15/2018 CLINICAL DATA:  Fever and vomiting today. EXAM: PORTABLE CHEST 1 VIEW COMPARISON:  None. FINDINGS: Fullness of the SUPERIOR mediastinum is noted, question goiter. The remainder of the cardiomediastinal silhouette is unremarkable. There is no evidence of focal airspace disease, pulmonary edema, suspicious pulmonary nodule/mass, pleural effusion, or pneumothorax. No acute bony abnormalities are identified. IMPRESSION: 1. No evidence of acute cardiopulmonary disease. 2. Fullness of the SUPERIOR mediastinum which may represent enlarged thyroid gland. Consider ultrasound for further evaluation. Electronically Signed   By: Margarette Canada M.D.   On: 09/15/2018 17:31   US Abdomen Limited Ruq  Result Date: 09/15/2018 CLINICAL DATA:  85 year old female with elevated LFTs. EXAM: ULTRASOUND ABDOMEN LIMITED RIGHT UPPER QUADRANT COMPARISON:  None. FINDINGS: Gallbladder: Multiple mobile gallstones are identified, the  largest measuring 1.1 cm. Gallbladder wall thickening is present. There is no evidence of pericholecystic fluid or sonographic Murphy sign. Apparent gallbladder wall calcifications noted. Common bile duct: Diameter: 3.3 mm. No evidence of intrahepatic or extrahepatic biliary dilatation. Liver: Increased hepatic echogenicity noted. No focal hepatic abnormalities are noted. Portal vein is patent on color Doppler imaging with normal direction of blood flow towards the liver. Other: None. IMPRESSION: 1. Cholelithiasis with gallbladder wall thickening but without pericholecystic fluid or sonographic Murphy sign - equivocal for acute cholecystitis. 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 2. No evidence of biliary dilatation. 3.  Hepatic steatosis Electronically Signed   By: Margarette Canada M.D.   On: 09/15/2018 15:40    Assessment/Plan 1. Acute upper GI bleed GI consulted. Continue IV Protonix. N.p.o. except medication. H&H relatively stable for now.  2.  Acute cholecystitis. General surgery consulted. Elevated LFTs. CT scan shows no evidence of elevated dilated CBD. We will follow recommendation of GI and general surgery. Currently on IV Zosyn.  3.  Acute kidney injury. In the setting of dehydration from vomiting as well as active infection. Continue aggressive IV hydration. Avoid nephrotoxic medications. Monitor renal function.  4.  Chronic neuropathy pain. Patient is on Lyrica. We will hold .  5.  Hypotension. Multifactorial. In the setting of infection as well as hemorrhage. Monitor.  Nutrition: N.p.o. DVT Prophylaxis: SCD, pharmacological prophylaxis contraindicated due to Acute bleed  Advance goals of care discussion: Full code  Consults: GI and general surgery  Family Communication: no family was present at bedside, at the time of interview.  Disposition: Admitted as inpatient, step-down. Likely to be discharged home, in 3-4 days.  Severity of Illness: The appropriate patient status for this patient is INPATIENT. Inpatient status is judged to be reasonable and necessary in order to provide the required intensity of service to ensure the patient's safety. The patient's presenting symptoms, physical exam findings, and initial radiographic and laboratory data in the context of their chronic comorbidities is felt to place them at high risk for further clinical deterioration. Furthermore, it is not anticipated that the patient will be medically stable for discharge from the hospital within 2 midnights of admission. The following factors support the patient status of inpatient.   " The patient's presenting symptoms include vomiting of blood, blood pressure in 80s and lethargy. " The worrisome  physical exam findings include hypotension, leukocytosis. " The initial radiographic and laboratory data are worrisome because of acute cholecystitis. " The chronic co-morbidities include hypertension prior GI bleed.   * I certify that at the point of admission it is my clinical judgment that the patient will require inpatient hospital care spanning beyond 2 midnights from the point of admission due to high intensity of service, high risk for further deterioration and high frequency of surveillance required.*    Author: Berle Mull, MD [facility] 09/15/2018  To reach On-call, see care teams to locate the attending and reach out to them via [email or web address]. If 7PM-7AM, please contact night-coverage If you still have difficulty reaching the attending provider, please page the [facility] (Director on Call) for [facility] on amion for assistance.

## 2018-09-15 NOTE — Consult Note (Signed)
[name] 1927-05-20  [id number].    Requesting MD: Kennith Maes, PA-C Chief Complaint: Abdominal pain, N/V Reason for Consult: Possible cholecystitis, elevated LFT's   HPI: [name] is a 84 y.o. female with a history of hypertension, CKD, prior upper GI bleed past surgical history of abdominal hysterectomy who presents with abdominal pain, nausea, vomiting.  Patient is unable to provide much of a history as she states "I am not able to remember".  Her daughter is at bedside and helps provide history.  Patient reportedly had one episode of coffee-ground like emesis 1 week ago.  There is question if she has some right upper quadrant abdominal pain at that time and was scheduled to have an outpatient ultrasound of her gallbladder.  Since that time she was asymptomatic and doing well until this morning when she began having multiple, large episodes of coffee-ground emesis today.  She is unsure of any abdominal pain was related to these episodes.  Daughters reports that the patient had an episode of melanous stools.  She does not drink EtOH, take NSAIDs or anticoagulants.  She denies any iron use or Pepto-Bismol use.  She currently denies any abdominal pain but then states "my whole body hurts".  The daughter does note that her mom does occasionally say she has pain underneath her "right rib cage".  She is unsure if this is postprandial.  She has never been told she had gallstones before. No associated fever, chills, shortness of breath, chest pain, constipation, or diarrhea.  She does report symptoms not onset after eating.  No exacerbating or relieving factors reported by family.   In the ED patient arrived with a soft blood pressure of 80/44, mildly tachycardic, afebrile.  WBC 18.2.  Hemoglobin 11.6.  Creatinine 2.06.  Alk phos 271, AST 177, ALT 130, T bili 5.7.  Lipase 27.  Fecal occult positive.  She did have some minor quadrant tenderness on EDPs exam for which a right upper  quadrant ultrasound was ordered.  This showed multiple mobile gallstones, measuring up to 1.1 cm, with gallbladder wall thickening but without pericholecystic fluid or sonographic Murphy sign-equivocal for acute cholecystitis.  There is possible gallbladder wall calcification/porcelain gallbladder which may reflect gallbladder carcinoma.  Common bile duct within normal limits at 3.3 mm.  General surgery was asked to see.  EDP consulted hospitalist for admission and GI for consultation of elevated LFTs and GI bleed.  To note on patient's history tab she does have cancer listed, family is unsure if she has had cancer in the past.   ROS: Review of Systems  Constitutional: Positive for malaise/fatigue. Negative for chills and fever.  Respiratory: Positive for cough. Negative for hemoptysis and shortness of breath.   Cardiovascular: Negative for chest pain and leg swelling.  Gastrointestinal: Positive for abdominal pain, melena, nausea and vomiting. Negative for blood in stool, constipation and diarrhea.  Genitourinary: Negative for dysuria.  All other systems reviewed and are negative.   Family History  Problem Relation Age of Onset   Rheumatologic disease Mother    Diabetes Mother     Past Medical History:  Diagnosis Date   Arthritis    Back pain    Cancer ([place])    GERD (gastroesophageal reflux disease)    Gout    Gout    History of left foot drop    HTN (hypertension)    Neuropathy    Osteoarthritis    Pneumonia    Renal insufficiency  Past Surgical History:  Procedure Laterality Date   ABDOMINAL HYSTERECTOMY     CATARACT EXTRACTION, BILATERAL     EPIDURAL STEROID INJECTION     ESOPHAGOGASTRODUODENOSCOPY (EGD) WITH PROPOFOL N/A 02/27/2016   Procedure: ESOPHAGOGASTRODUODENOSCOPY (EGD) WITH PROPOFOL;  Surgeon: Ronald Lobo, MD;  Location: [facility];  Service: Endoscopy;  Laterality: N/A;   MOHS SURGERY     right carpal tunnel release     TONSILLECTOMY      TOTAL HIP ARTHROPLASTY      Social History:  reports that she has never smoked. She has never used smokeless tobacco. She reports that she does not drink alcohol or use drugs.  Allergies:  Allergies  Allergen Reactions   Aspirin Other (See Comments)    Reaction unknown   Nsaids Other (See Comments)    Bleeding, skin hemorrhages   Silver Rash    (Not in a hospital admission)    Physical Exam: Blood pressure (!) 125/52, pulse (!) 102, temperature 98.4 F (36.9 C), temperature source Oral, resp. rate 20, height 5' 5" (1.651 m), weight 73.5 kg, SpO2 97 %. General: elderly, pleasant, WD, WN female who is laying in bed in NAD HEENT: head is normocephalic, atraumatic.  Sclera are with mild icterus.  PERRL.  Ears and nose without any masses or lesions.  Mouth is pink and moist. Dentition fair.  Heart: Tachycardic with regular rhythm.  No obvious murmurs noted.  Palpable radial pulses bilaterally Lungs: CTAB, no wheezes, rhonchi, or rales noted.  Respiratory effort nonlabored Abd: soft, mildly distended, patient reports generalized tenderness with palpation but notes that epigastric and right upper quadrant tenderness is the worst. +BS. no masses, hernias, or organomegaly.  Well-healed vertical incisional scar from prior abdominal hysterectomy MS: all 4 extremities are symmetrical with no cyanosis, clubbing, or edema. Skin: warm and dry with no masses, lesions, or rashes Psych: A&Ox3 with an appropriate affect.   Results for orders placed or performed during the hospital encounter of 09/15/18 (from the past 48 hour(s))  Comprehensive metabolic panel     Status: Abnormal   Collection Time: 09/15/18 12:54 PM  Result Value Ref Range   Sodium 140 135 - 145 mmol/L   Potassium 3.4 (L) 3.5 - 5.1 mmol/L   Chloride 108 98 - 111 mmol/L   CO2 22 22 - 32 mmol/L   Glucose, Bld 155 (H) 70 - 99 mg/dL   BUN 35 (H) 8 - 23 mg/dL   Creatinine, Ser 2.06 (H) 0.44 - 1.00 mg/dL   Calcium 8.5 (L)  8.9 - 10.3 mg/dL   Total Protein 6.7 6.5 - 8.1 g/dL   Albumin 3.0 (L) 3.5 - 5.0 g/dL   AST 177 (H) 15 - 41 U/L   ALT 130 (H) 0 - 44 U/L   Alkaline Phosphatase 271 (H) 38 - 126 U/L   Total Bilirubin 5.7 (H) 0.3 - 1.2 mg/dL   GFR calc non Af Amer 21 (L) >60 mL/min   GFR calc Af Amer 24 (L) >60 mL/min   Anion gap 10 5 - 15    Comment: Performed at [facility], [place] [address], [place], [place] [address]  CBC WITH DIFFERENTIAL     Status: Abnormal   Collection Time: 09/15/18 12:54 PM  Result Value Ref Range   WBC 18.2 (H) 4.0 - 10.5 K/uL   RBC 3.74 (L) 3.87 - 5.11 MIL/uL   Hemoglobin 11.6 (L) 12.0 - 15.0 g/dL   HCT 35.1 (L) 36.0 - 46.0 %  MCV 93.9 80.0 - 100.0 fL   MCH 31.0 26.0 - 34.0 pg   MCHC 33.0 30.0 - 36.0 g/dL   RDW 13.3 11.5 - 15.5 %   Platelets 286 150 - 400 K/uL   nRBC 0.0 0.0 - 0.2 %   Neutrophils Relative % 89 %   Neutro Abs 16.2 (H) 1.7 - 7.7 K/uL   Lymphocytes Relative 5 %   Lymphs Abs 1.0 0.7 - 4.0 K/uL   Monocytes Relative 5 %   Monocytes Absolute 0.9 0.1 - 1.0 K/uL   Eosinophils Relative 0 %   Eosinophils Absolute 0.0 0.0 - 0.5 K/uL   Basophils Relative 0 %   Basophils Absolute 0.0 0.0 - 0.1 K/uL   Immature Granulocytes 1 %   Abs Immature Granulocytes 0.16 (H) 0.00 - 0.07 K/uL    Comment: Performed at [facility], [place] [address] [place], [place] [address]  Protime-INR     Status: None   Collection Time: 09/15/18 12:54 PM  Result Value Ref Range   Prothrombin Time 14.4 11.4 - 15.2 seconds   INR 1.1 0.8 - 1.2    Comment: (NOTE) INR goal varies based on device and disease states. Performed at [facility], [place] [address] [place], [place] [address]   Type and screen [place]     Status: None   Collection Time: 09/15/18 12:54 PM  Result Value Ref Range   ABO/RH(D) O POS    Antibody Screen NEG    Sample Expiration      09/18/2018,2359 Performed at [facility], [place] [address], [place], [place] [address]   Lipase, blood     Status: None   Collection Time: 09/15/18 12:54 PM  Result Value Ref Range   Lipase 27 11 - 51 U/L    Comment: Performed at [facility], [place] [address], [place], [place] [address]  POC occult blood, ED     Status: Abnormal   Collection Time: 09/15/18  1:16 PM  Result Value Ref Range   Fecal Occult Bld POSITIVE (A) NEGATIVE   US Abdomen Limited Ruq  Result Date: 09/15/2018 CLINICAL DATA:  85 year old female with elevated LFTs. EXAM: ULTRASOUND ABDOMEN LIMITED RIGHT UPPER QUADRANT COMPARISON:  None. FINDINGS: Gallbladder: Multiple mobile gallstones are identified, the largest measuring 1.1 cm. Gallbladder wall thickening is present. There is no evidence of pericholecystic fluid or sonographic Murphy sign. Apparent gallbladder wall calcifications noted. Common bile duct: Diameter: 3.3 mm. No evidence of intrahepatic or extrahepatic biliary dilatation. Liver: Increased hepatic echogenicity noted. No focal hepatic abnormalities are noted. Portal vein is patent on color Doppler imaging with normal direction of blood flow towards the liver. Other: None. IMPRESSION: 1. Cholelithiasis with gallbladder wall thickening but without pericholecystic fluid or sonographic Murphy sign - equivocal for acute cholecystitis. 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 2. No evidence of biliary dilatation. 3. Hepatic steatosis Electronically Signed   By: Margarette Canada M.D.   On: 09/15/2018 15:40   Anti-infectives (From admission, onward)   Start     Dose/Rate Route Frequency Ordered Stop   09/15/18 1545  piperacillin-tazobactam (ZOSYN) IVPB 3.375 g     3.375 g 100 mL/hr over 30 Minutes Intravenous  Once 09/15/18 1542        Assessment/Plan HTN CKD - Cr 2.06 Prior upper GI bleed  Hematemesis and melena - Fecal occult +. Hgb 11.6. Per GI and  medicine.  RUQ  abdominal pain, N/V Possible cholecystitis - [place] w/ gallstones and gallbladder wall thickening.  There is question of a porcelain gallbladder. - WBC 18.2.   - Alk phos 271, AST 177, ALT 130, T bili 5.7.  Lipase 27.  -Recommend obtaining CT scan to further evaluate ultrasound findings of porcelain gallbladder.  -Recommend GI consultation for elevated LFTs.  T bili is elevated at 5.7 with common bile duct within normal limits.  -We will follow-up on CT scan with further recommendations -Leave n.p.o. -Agree with IV antibiotics -We will follow  Jillyn Ledger, [facility] 09/15/2018, 4:45 PM Pager: [phone]

## 2018-09-15 NOTE — Progress Notes (Signed)
Pharmacy Antibiotic Note  [name] is a 85 y.o. female admitted on 09/15/2018 with intra-abdominal infection.  Pharmacy has been consulted for Zosyn dosing.  Admit with AKI (baseline Scr ~1.05).  CrCl borderline for renal dose adjustment but anticipate this will improve with hydration.   Plan: Zosyn 3.375g IV q8h (4 hour infusion).  Monitor renal function and cx data   Height: 5\' 5"  (165.1 cm) Weight: 162 lb (73.5 kg) IBW/kg (Calculated) : 57  Temp (24hrs), Avg:98.4 F (36.9 C), Min:98.4 F (36.9 C), Max:98.4 F (36.9 C)  Recent Labs  Lab 09/15/18 1254 09/15/18 1721  WBC 18.2*  --   CREATININE 2.06*  --   LATICACIDVEN  --  1.1    Estimated Creatinine Clearance: 19 mL/min (A) (by C-G formula based on SCr of 2.06 mg/dL (H)).    Allergies  Allergen Reactions  . Aspirin Other (See Comments)    Reaction unknown  . Nsaids Other (See Comments)    Bleeding, skin hemorrhages  . Silver Rash    Antimicrobials this admission: 8/27 Zosyn >>   Dose adjustments this admission:  Microbiology results: 8/27 BCx:  8/27 COVID: negative  Thank you for allowing pharmacy to be a part of this patient's care.  Biagio Borg 09/15/2018 8:22 PM

## 2018-09-15 NOTE — ED Notes (Signed)
Report called to receiving RN.

## 2018-09-15 NOTE — ED Notes (Signed)
Hemocolt given to RN, and MD Sedonia Small

## 2018-09-15 NOTE — Progress Notes (Signed)
ED TO INPATIENT HANDOFF REPORT  Name/Age/Gender [name] 85 y.o. female  Code Status    Code Status Orders  (From admission, onward)         Start     Ordered   09/15/18 2006  Full code  Continuous     09/15/18 2006        Code Status History    Date Active Date Inactive Code Status Order ID Comments User Context   02/25/2016 0456 02/28/2016 2033 Full Code [id number]  Etta Quill, DO Inpatient   Advance Care Planning Activity      Home/SNF/Other Home  Chief Complaint emesis   Level of Care/Admitting Diagnosis ED Disposition    ED Disposition Condition [place]: [place] [[id number]]  Level of Care: Stepdown [14]  Admit to SDU based on following criteria: Hemodynamic compromise or significant risk of instability:  Patient requiring short term acute titration and management of vasoactive drips, and invasive monitoring (i.e., CVP and Arterial line).  Covid Evaluation: Person Under Investigation (PUI)  Diagnosis: Acute upper GI bleed [id number]  Admitting Physician: Lavina Hamman [id number]  Attending Physician: Lavina Hamman [id number]  Estimated length of stay: past midnight tomorrow  Certification:: I certify this patient will need inpatient services for at least 2 midnights  PT Class (Do Not Modify): Inpatient [101]  PT Acc Code (Do Not Modify): Private [1]       Medical History Past Medical History:  Diagnosis Date  . Arthritis   . Back pain   . Cancer ([place])   . GERD (gastroesophageal reflux disease)   . Gout   . Gout   . History of left foot drop   . HTN (hypertension)   . Neuropathy   . Osteoarthritis   . Pneumonia   . Renal insufficiency     Allergies Allergies  Allergen Reactions  . Aspirin Other (See Comments)    Reaction unknown  . Nsaids Other (See Comments)    Bleeding, skin hemorrhages  . Silver Rash    IV Location/Drains/Wounds Patient Lines/Drains/Airways Status   Active  Line/Drains/Airways    Name:   Placement date:   Placement time:   Site:   Days:   Peripheral IV 09/15/18 Right Antecubital   09/15/18    1248    Antecubital   less than 1   Peripheral IV 09/15/18 Right Antecubital   09/15/18    1848    Antecubital   less than 1          Labs/Imaging Results for orders placed or performed during the hospital encounter of 09/15/18 (from the past 48 hour(s))  Comprehensive metabolic panel     Status: Abnormal   Collection Time: 09/15/18 12:54 PM  Result Value Ref Range   Sodium 140 135 - 145 mmol/L   Potassium 3.4 (L) 3.5 - 5.1 mmol/L   Chloride 108 98 - 111 mmol/L   CO2 22 22 - 32 mmol/L   Glucose, Bld 155 (H) 70 - 99 mg/dL   BUN 35 (H) 8 - 23 mg/dL   Creatinine, Ser 2.06 (H) 0.44 - 1.00 mg/dL   Calcium 8.5 (L) 8.9 - 10.3 mg/dL   Total Protein 6.7 6.5 - 8.1 g/dL   Albumin 3.0 (L) 3.5 - 5.0 g/dL   AST 177 (H) 15 - 41 U/L   ALT 130 (H) 0 - 44 U/L   Alkaline Phosphatase 271 (H) 38 - 126 U/L   Total  Bilirubin 5.7 (H) 0.3 - 1.2 mg/dL   GFR calc non Af Amer 21 (L) >60 mL/min   GFR calc Af Amer 24 (L) >60 mL/min   Anion gap 10 5 - 15    Comment: Performed at [facility], [place] [address] [place], [place] [address]  CBC WITH DIFFERENTIAL     Status: Abnormal   Collection Time: 09/15/18 12:54 PM  Result Value Ref Range   WBC 18.2 (H) 4.0 - 10.5 K/uL   RBC 3.74 (L) 3.87 - 5.11 MIL/uL   Hemoglobin 11.6 (L) 12.0 - 15.0 g/dL   HCT 35.1 (L) 36.0 - 46.0 %   MCV 93.9 80.0 - 100.0 fL   MCH 31.0 26.0 - 34.0 pg   MCHC 33.0 30.0 - 36.0 g/dL   RDW 13.3 11.5 - 15.5 %   Platelets 286 150 - 400 K/uL   nRBC 0.0 0.0 - 0.2 %   Neutrophils Relative % 89 %   Neutro Abs 16.2 (H) 1.7 - 7.7 K/uL   Lymphocytes Relative 5 %   Lymphs Abs 1.0 0.7 - 4.0 K/uL   Monocytes Relative 5 %   Monocytes Absolute 0.9 0.1 - 1.0 K/uL   Eosinophils Relative 0 %   Eosinophils Absolute 0.0 0.0 - 0.5 K/uL   Basophils Relative 0 %   Basophils Absolute 0.0 0.0 - 0.1  K/uL   Immature Granulocytes 1 %   Abs Immature Granulocytes 0.16 (H) 0.00 - 0.07 K/uL    Comment: Performed at [facility], [place] [address], [place], [place] [address]  Protime-INR     Status: None   Collection Time: 09/15/18 12:54 PM  Result Value Ref Range   Prothrombin Time 14.4 11.4 - 15.2 seconds   INR 1.1 0.8 - 1.2    Comment: (NOTE) INR goal varies based on device and disease states. Performed at [facility], [place] [address], [place], [place] [address]   Type and screen [place]     Status: None   Collection Time: 09/15/18 12:54 PM  Result Value Ref Range   ABO/RH(D) O POS    Antibody Screen NEG    Sample Expiration      09/18/2018,2359 Performed at [facility], [place] [address] [place], [place] [address]   Lipase, blood     Status: None   Collection Time: 09/15/18 12:54 PM  Result Value Ref Range   Lipase 27 11 - 51 U/L    Comment: Performed at [facility], [place] [address] [place], [place] [address]  POC occult blood, ED     Status: Abnormal   Collection Time: 09/15/18  1:16 PM  Result Value Ref Range   Fecal Occult Bld POSITIVE (A) NEGATIVE  Lactic acid, plasma     Status: None   Collection Time: 09/15/18  5:21 PM  Result Value Ref Range   Lactic Acid, Venous 1.1 0.5 - 1.9 mmol/L    Comment: Performed at [facility], [place] [address], [place], [place] [address]  SARS Coronavirus 2 [facility] order, Performed in [facility] hospital lab) Nasopharyngeal Nasopharyngeal Swab     Status: None   Collection Time: 09/15/18  6:50 PM   Specimen: Nasopharyngeal Swab  Result Value Ref Range   SARS Coronavirus 2 NEGATIVE NEGATIVE    Comment: (NOTE) If result is NEGATIVE SARS-CoV-2 target nucleic acids are NOT DETECTED. The SARS-CoV-2 RNA is generally detectable in upper and lower  respiratory specimens  during the acute phase of infection. The lowest   concentration of SARS-CoV-2 viral copies this assay can detect is 250  copies / mL. A negative result does not preclude SARS-CoV-2 infection  and should not be used as the sole basis for treatment or other  patient management decisions.  A negative result may occur with  improper specimen collection / handling, submission of specimen other  than nasopharyngeal swab, presence of viral mutation(s) within the  areas targeted by this assay, and inadequate number of viral copies  (<250 copies / mL). A negative result must be combined with clinical  observations, patient history, and epidemiological information. If result is POSITIVE SARS-CoV-2 target nucleic acids are DETECTED. The SARS-CoV-2 RNA is generally detectable in upper and lower  respiratory specimens dur ing the acute phase of infection.  Positive  results are indicative of active infection with SARS-CoV-2.  Clinical  correlation with patient history and other diagnostic information is  necessary to determine patient infection status.  Positive results do  not rule out bacterial infection or co-infection with other viruses. If result is PRESUMPTIVE POSTIVE SARS-CoV-2 nucleic acids MAY BE PRESENT.   A presumptive positive result was obtained on the submitted specimen  and confirmed on repeat testing.  While 2019 novel coronavirus  (SARS-CoV-2) nucleic acids may be present in the submitted sample  additional confirmatory testing may be necessary for epidemiological  and / or clinical management purposes  to differentiate between  SARS-CoV-2 and other Sarbecovirus currently known to infect humans.  If clinically indicated additional testing with an alternate test  methodology [id number]) is advised. The SARS-CoV-2 RNA is generally  detectable in upper and lower respiratory sp ecimens during the acute  phase of infection. The expected result is Negative. Fact Sheet for Patients:  StrictlyIdeas.no Fact Sheet  for Healthcare Providers: BankingDealers.co.za This test is not yet approved or cleared by the [place] FDA and has been authorized for detection and/or diagnosis of SARS-CoV-2 by FDA under an Emergency Use Authorization (EUA).  This EUA will remain in effect (meaning this test can be used) for the duration of the COVID-19 declaration under Section 564(b)(1) of the Act, 21 U.S.C. section 360bbb-3(b)(1), unless the authorization is terminated or revoked sooner. Performed at [facility], [place] [address], [place], [place] [address]    Ct Abdomen Pelvis Wo Contrast  Result Date: 09/15/2018 CLINICAL DATA:  Cholelithiasis, porcelain gallbladder ultrasound EXAM: CT ABDOMEN AND PELVIS WITHOUT CONTRAST TECHNIQUE: Multidetector CT imaging of the abdomen and pelvis was performed following the standard protocol without IV contrast. COMPARISON:  Same-day ultrasound the right upper quadrant FINDINGS: Lower chest: Bandlike areas of basilar atelectasis and/or scarring. Cardiomegaly. Calcification of the aortic leaflets. Coronary calcifications. Hepatobiliary: No focal liver lesion. The gallbladder is partially decompressed around several calcified gallstones. The wall appears thickened and edematous but without mural calcification. There is pericholecystic fluid in inflammation tracking throughout the gallbladder fossa and towards the tip of the liver. No visible biliary ductal dilatation or calcified intraductal gallstones. Pancreas: Fatty replacement of the pancreas. No pancreatic ductal dilatation or surrounding inflammatory changes. Spleen: This Normal in size without focal abnormality. Adrenals/Urinary Tract: Kidneys appear somewhat atrophic and diminutive in size. Kidneys are otherwise unremarkable, without renal calculi, suspicious lesion, or hydronephrosis. Bladder is grossly unremarkable though difficult to assess fully given extensive streak artifact in the  pelvis. Stomach/Bowel: Distal esophagus stomach and duodenal sweep are unremarkable. No small bowel thickening or dilatation. The appendix is not well visualized as the  cecal tip is partially obscured by streak artifact. Mild thickening of the hepatic flexure, likely reactive. Distal colon has a normal appearance. Vascular/Lymphatic: Atherosclerotic plaque within the normal caliber aorta. No suspicious or enlarged lymph nodes in the included lymphatic chains. Reproductive: Uterus is surgically absent. No visible adnexal lesions though the pelvis is largely obscured by streak artifact. Other: No abdominopelvic free air. Reactive free fluid tracking along the attic tip and towards the right paracolic gutter likely tracking from the gallbladder fossa. No bowel containing hernias. Musculoskeletal: Multilevel degenerative changes are present in the imaged portions of the spine. No acute osseous abnormality or suspicious osseous lesion. Total left hip arthroplasty and plate and screw construct of the left ilium results in extensive streak artifact which limits details of the adjacent osseous structures and soft tissues of the pelvis. IMPRESSION: 1. Cholelithiasis with gallbladder wall thickening and pericholecystic fluid tracking throughout the gallbladder fossa and towards the tip of the liver, consistent with acute cholecystitis. 2. No mural calcification of the gallbladder. 3. Mild thickening of the hepatic flexure, likely reactive. 4. Evaluation of the pelvis limited by extensive streak artifact from patient's left hip arthroplasty and iliac plate screw construct. 5. Aortic Atherosclerosis (ICD10-I70.0). Electronically Signed   By: Lovena Le M.D.   On: 09/15/2018 18:13   Dg Chest Port 1 View  Result Date: 09/15/2018 CLINICAL DATA:  Fever and vomiting today. EXAM: PORTABLE CHEST 1 VIEW COMPARISON:  None. FINDINGS: Fullness of the SUPERIOR mediastinum is noted, question goiter. The remainder of the  cardiomediastinal silhouette is unremarkable. There is no evidence of focal airspace disease, pulmonary edema, suspicious pulmonary nodule/mass, pleural effusion, or pneumothorax. No acute bony abnormalities are identified. IMPRESSION: 1. No evidence of acute cardiopulmonary disease. 2. Fullness of the SUPERIOR mediastinum which may represent enlarged thyroid gland. Consider ultrasound for further evaluation. Electronically Signed   By: Margarette Canada M.D.   On: 09/15/2018 17:31   US Abdomen Limited Ruq  Result Date: 09/15/2018 CLINICAL DATA:  85 year old female with elevated LFTs. EXAM: ULTRASOUND ABDOMEN LIMITED RIGHT UPPER QUADRANT COMPARISON:  None. FINDINGS: Gallbladder: Multiple mobile gallstones are identified, the largest measuring 1.1 cm. Gallbladder wall thickening is present. There is no evidence of pericholecystic fluid or sonographic Murphy sign. Apparent gallbladder wall calcifications noted. Common bile duct: Diameter: 3.3 mm. No evidence of intrahepatic or extrahepatic biliary dilatation. Liver: Increased hepatic echogenicity noted. No focal hepatic abnormalities are noted. Portal vein is patent on color Doppler imaging with normal direction of blood flow towards the liver. Other: None. IMPRESSION: 1. Cholelithiasis with gallbladder wall thickening but without pericholecystic fluid or sonographic Murphy sign - equivocal for acute cholecystitis. 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 2. No evidence of biliary dilatation. 3. Hepatic steatosis Electronically Signed   By: Margarette Canada M.D.   On: 09/15/2018 15:40    Pending Labs Unresulted Labs (From admission, onward)    Start     Ordered   09/16/18 0500  Comprehensive metabolic panel  Tomorrow morning,   R     09/15/18 2006   09/16/18 0500  CBC  Tomorrow morning,   R     09/15/18 2006   09/15/18 1448  Blood culture (routine x 2)  BLOOD CULTURE X 2,   STAT     09/15/18  1447          Vitals/Pain Today's Vitals   09/15/18 1800 09/15/18 1830 09/15/18 1900 09/15/18 2007  BP:    (!) 150/60  Pulse: 100 99 (!) 102 (!) 104  Resp: 18 20 16  (!) 21  Temp:      TempSrc:      SpO2: 97% 96% 97% 96%  Weight:      Height:      PainSc:        Isolation Precautions No active isolations  Medications Medications  pantoprazole (PROTONIX) 80 mg in sodium chloride 0.9 % 250 mL (0.32 mg/mL) infusion (8 mg/hr Intravenous New Bag/Given 09/15/18 1348)  mirabegron ER (MYRBETRIQ) tablet 50 mg (has no administration in time range)  sodium chloride flush (NS) 0.9 % injection 3 mL (has no administration in time range)  0.9 %  sodium chloride infusion (has no administration in time range)  acetaminophen (TYLENOL) tablet 650 mg (has no administration in time range)    Or  acetaminophen (TYLENOL) suppository 650 mg (has no administration in time range)  ondansetron (ZOFRAN) tablet 4 mg (has no administration in time range)    Or  ondansetron (ZOFRAN) injection 4 mg (has no administration in time range)  piperacillin-tazobactam (ZOSYN) IVPB 3.375 g (has no administration in time range)  sodium chloride 0.9 % bolus 1,000 mL (0 mLs Intravenous Stopped 09/15/18 2108)  pantoprazole (PROTONIX) 80 mg in sodium chloride 0.9 % 100 mL IVPB (0 mg Intravenous Stopped 09/15/18 1408)  ondansetron (ZOFRAN) injection 4 mg (4 mg Intravenous Given 09/15/18 1326)  sodium chloride 0.9 % bolus 1,000 mL (0 mLs Intravenous Stopped 09/15/18 1724)  piperacillin-tazobactam (ZOSYN) IVPB 3.375 g (0 g Intravenous Stopped 09/15/18 1954)    Mobility walks with device

## 2018-09-15 NOTE — ED Provider Notes (Signed)
Medical screening examination/treatment/procedure(s) were conducted as a shared visit with non-physician practitioner(s) and myself.  I personally evaluated the patient during the encounter.     85 year old female with likely upper GI bleed.  Daughter reports episode of coffee-ground emesis about a week ago.  Seem to be otherwise doing okay until another large-volume episode of coffee-ground emesis today.  She has a past history of upper GI bleed.  Upper endoscopy from February 2018 showing severe acute erosive esophagitis, nonbleeding esophageal ulcer at the GE junction, nonbleeding pyloric channel ulcers and gastric polyp.  She denies any acute pain.  She is not anticoagulated..  On exam, she appears pale but not in any acute distress.  She is mildly tachycardic at rest with a heart rate approximately 100 - 110.  Initial blood pressure was 80/44.  Abdominal exam is pretty benign. Perhaps mildy tender suprapubically. 2 IVs placed.  Protonix.  Type and screen.  We will start with IV fluids.  May potentially need blood transfusion.  Prior evaluations by Dr. Cristina Gong, GI.  GI will be consulted.  Will need admission.   W/u significant for leukocytosis and abnormal LFTs. Repeat abdominal exam seems about the same to me. She has some dementia though and doesn't react much in general. Subsequent [place] equivocal for cholecystitis. Abx. BP still soft but improving. Surgery consulted. Admit.    Virgel Manifold, MD 09/15/18 [phone]

## 2018-09-16 LAB — CBC
HCT: 28 % — ABNORMAL LOW (ref 36.0–46.0)
Hemoglobin: 9.5 g/dL — ABNORMAL LOW (ref 12.0–15.0)
MCH: 31.4 pg (ref 26.0–34.0)
MCHC: 33.9 g/dL (ref 30.0–36.0)
MCV: 92.4 fL (ref 80.0–100.0)
Platelets: 175 10*3/uL (ref 150–400)
RBC: 3.03 MIL/uL — ABNORMAL LOW (ref 3.87–5.11)
RDW: 13.5 % (ref 11.5–15.5)
WBC: 10.4 10*3/uL (ref 4.0–10.5)
nRBC: 0 % (ref 0.0–0.2)

## 2018-09-16 LAB — COMPREHENSIVE METABOLIC PANEL
ALT: 101 U/L — ABNORMAL HIGH (ref 0–44)
AST: 103 U/L — ABNORMAL HIGH (ref 15–41)
Albumin: 2.6 g/dL — ABNORMAL LOW (ref 3.5–5.0)
Alkaline Phosphatase: 198 U/L — ABNORMAL HIGH (ref 38–126)
Anion gap: 10 (ref 5–15)
BUN: 35 mg/dL — ABNORMAL HIGH (ref 8–23)
CO2: 18 mmol/L — ABNORMAL LOW (ref 22–32)
Calcium: 8 mg/dL — ABNORMAL LOW (ref 8.9–10.3)
Chloride: 116 mmol/L — ABNORMAL HIGH (ref 98–111)
Creatinine, Ser: 1.51 mg/dL — ABNORMAL HIGH (ref 0.44–1.00)
GFR calc Af Amer: 35 mL/min — ABNORMAL LOW (ref >60)
GFR calc non Af Amer: 31 mL/min — ABNORMAL LOW (ref >60)
Glucose, Bld: 112 mg/dL — ABNORMAL HIGH (ref 70–99)
Potassium: 3.2 mmol/L — ABNORMAL LOW (ref 3.5–5.1)
Sodium: 144 mmol/L (ref 135–145)
Total Bilirubin: 5 mg/dL — ABNORMAL HIGH (ref 0.3–1.2)
Total Protein: 5.6 g/dL — ABNORMAL LOW (ref 6.5–8.1)

## 2018-09-16 LAB — MAGNESIUM: Magnesium: 1.8 mg/dL (ref 1.7–2.4)

## 2018-09-16 LAB — GLUCOSE, CAPILLARY: Glucose-Capillary: 99 mg/dL (ref 70–99)

## 2018-09-16 MED ORDER — POTASSIUM CHLORIDE 10 MEQ/100ML IV SOLN
10.0000 meq | INTRAVENOUS | Status: AC
  Administered 2018-09-16 (×4): 10 meq via INTRAVENOUS
  Filled 2018-09-16 (×4): qty 100

## 2018-09-16 MED ORDER — POTASSIUM CHLORIDE CRYS ER 10 MEQ PO TBCR
40.0000 meq | EXTENDED_RELEASE_TABLET | Freq: Once | ORAL | Status: AC
  Administered 2018-09-16: 40 meq via ORAL
  Filled 2018-09-16: qty 4

## 2018-09-16 MED ORDER — LACTATED RINGERS IV SOLN
INTRAVENOUS | Status: DC
  Administered 2018-09-16 – 2018-09-19 (×6): via INTRAVENOUS

## 2018-09-16 MED ORDER — MAGNESIUM SULFATE 2 GM/50ML IV SOLN
2.0000 g | Freq: Once | INTRAVENOUS | Status: AC
  Administered 2018-09-16: 2 g via INTRAVENOUS
  Filled 2018-09-16: qty 50

## 2018-09-16 NOTE — Progress Notes (Signed)
Per MRI tech the machine is completely down and will not be up until tomorrow. Have paged Will PA to notify him in case pt needs to go to [facility] for test. Awaiting call back

## 2018-09-16 NOTE — Progress Notes (Signed)
[facility] Progress Note  Patient: [name] [id number]   PCP: Earney Mallet, MD DOB: 02-05-1927   DOA: 09/15/2018   DOS: 09/16/2018   Date of Service: the patient was seen and examined on 09/16/2018  Brief hospital course: Pt. with PMH of HTN, CKD, prior GI bleed; admitted on 09/15/2018, presented with complaint of coffee colored emesis and fever, was found to have acute cholecystitis as well as possible upper GI bleed and acute kidney injury. Currently further plan is continue IV antibiotics and further work-up depending on GI and general surgery recommendation.  Subjective: Currently reports mild abdominal pain.  No nausea.  No further vomiting.  No BM so far.  Passing gas.  No fever overnight.  Blood pressure improved.  Assessment and Plan: 1. Acute upper GI bleed GI consulted. Continue IV Protonix. H&H relatively stable for now. GI recommends conservative management.  Currently on IV Protonix.  2.  Acute cholecystitis. General surgery consulted. Elevated LFTs. CT scan shows no evidence of elevated dilated CBD. Currently on IV Zosyn. GI and general surgery recommends MRCP.  Currently MRI machine at [place] [facility] is not working.  We will perform MRI tomorrow either at [facility] or [facility].  3.  Acute kidney injury. In the setting of dehydration from vomiting as well as active infection. Continue aggressive IV hydration. Avoid nephrotoxic medications. Monitor renal function. Renal function getting better.  Still not back to baseline.  4.  Chronic neuropathy pain. Patient is on Lyrica. We will hold .  5.  Hypotension. Multifactorial. In the setting of infection as well as hemorrhage. Monitor.  Improving.  Diet: NPO secondary to cholecystitis as well as GI bleed DVT Prophylaxis: SCD, pharmacological prophylaxis contraindicated due to Acute GI bleed  Advance goals of care discussion: Full code  Family Communication: no family was present at  bedside, at the time of interview. The pt provided permission to discuss medical plan with the family. Opportunity was given to ask question and all questions were answered satisfactorily.   Disposition:  Discharge to Home .  Consultants: gastroenterology  General surgery  Procedures: none  Scheduled Meds: . Chlorhexidine Gluconate Cloth  6 each Topical Daily  . mirabegron ER  50 mg Oral Daily  . sodium chloride flush  3 mL Intravenous Q12H   Continuous Infusions: . lactated ringers 100 mL/hr at 09/16/18 1729  . pantoprozole (PROTONIX) infusion 8 mg/hr (09/16/18 1627)  . piperacillin-tazobactam (ZOSYN)  IV Stopped (09/16/18 1727)   PRN Meds: acetaminophen **OR** acetaminophen, ondansetron **OR** ondansetron (ZOFRAN) IV Antibiotics: Anti-infectives (From admission, onward)   Start     Dose/Rate Route Frequency Ordered Stop   09/16/18 0400  piperacillin-tazobactam (ZOSYN) IVPB 3.375 g     3.375 g 12.5 mL/hr over 240 Minutes Intravenous Every 8 hours 09/15/18 2027     09/15/18 1545  piperacillin-tazobactam (ZOSYN) IVPB 3.375 g     3.375 g 100 mL/hr over 30 Minutes Intravenous  Once 09/15/18 1542 09/15/18 1954       Objective: Physical Exam: Vitals:   09/16/18 1200 09/16/18 1248 09/16/18 1442 09/16/18 1600  BP: (!) 136/52   129/79  Pulse: 91   100  Resp: 19   19  Temp:  98.3 F (36.8 C) 98.8 F (37.1 C)   TempSrc:  Oral Oral   SpO2: 96%   97%  Weight:      Height:        Intake/Output Summary (Last 24 hours) at 09/16/2018 1731 Last data filed at  09/16/2018 1727 Gross per 24 hour  Intake 3744.47 ml  Output 2250 ml  Net 1494.47 ml   Filed Weights   09/15/18 1250 09/15/18 2200 09/16/18 0500  Weight: 73.5 kg 71.2 kg 71 kg   General: alert and oriented to time, place, and person. Appear in mild distress, affect appropriate Eyes: PERRL, Conjunctiva normal ENT: Oral Mucosa Clear, moist  Neck: no JVD, no Abnormal Mass Or lumps Cardiovascular: S1 and S2 Present, no  Murmur, peripheral pulses symmetrical Respiratory: normal respiratory effort, Bilateral Air entry equal and Decreased, no use of accessory muscle, Clear to Auscultation, no Crackles, no wheezes Abdomen: Bowel Sound present, Soft and mild tenderness, no hernia Skin: no rashes  Extremities: no Pedal edema, no calf tenderness Neurologic: normal without focal findings, mental status, speech normal, alert and oriented x3, PERLA, Motor strength 5/5 and symmetric and sensation grossly normal to light touch Gait not checked due to patient safety concerns  Data Reviewed: CBC: Recent Labs  Lab 09/15/18 1254 09/16/18 0154  WBC 18.2* 10.4  NEUTROABS 16.2*  --   HGB 11.6* 9.5*  HCT 35.1* 28.0*  MCV 93.9 92.4  PLT 286 0000000   Basic Metabolic Panel: Recent Labs  Lab 09/15/18 1254 09/16/18 0154  NA 140 144  K 3.4* 3.2*  CL 108 116*  CO2 22 18*  GLUCOSE 155* 112*  BUN 35* 35*  CREATININE 2.06* 1.51*  CALCIUM 8.5* 8.0*  MG  --  1.8    Liver Function Tests: Recent Labs  Lab 09/15/18 1254 09/16/18 0154  AST 177* 103*  ALT 130* 101*  ALKPHOS 271* 198*  BILITOT 5.7* 5.0*  PROT 6.7 5.6*  ALBUMIN 3.0* 2.6*   Recent Labs  Lab 09/15/18 1254  LIPASE 27   No results for input(s): AMMONIA in the last 168 hours. Coagulation Profile: Recent Labs  Lab 09/15/18 1254  INR 1.1   Cardiac Enzymes: No results for input(s): CKTOTAL, CKMB, CKMBINDEX, TROPONINI in the last 168 hours. BNP (last 3 results) No results for input(s): PROBNP in the last 8760 hours. CBG: Recent Labs  Lab 09/16/18 0748  GLUCAP 99   Studies: Ct Abdomen Pelvis Wo Contrast  Result Date: 09/15/2018 CLINICAL DATA:  Cholelithiasis, porcelain gallbladder ultrasound EXAM: CT ABDOMEN AND PELVIS WITHOUT CONTRAST TECHNIQUE: Multidetector CT imaging of the abdomen and pelvis was performed following the standard protocol without IV contrast. COMPARISON:  Same-day ultrasound the right upper quadrant FINDINGS: Lower chest:  Bandlike areas of basilar atelectasis and/or scarring. Cardiomegaly. Calcification of the aortic leaflets. Coronary calcifications. Hepatobiliary: No focal liver lesion. The gallbladder is partially decompressed around several calcified gallstones. The wall appears thickened and edematous but without mural calcification. There is pericholecystic fluid in inflammation tracking throughout the gallbladder fossa and towards the tip of the liver. No visible biliary ductal dilatation or calcified intraductal gallstones. Pancreas: Fatty replacement of the pancreas. No pancreatic ductal dilatation or surrounding inflammatory changes. Spleen: This Normal in size without focal abnormality. Adrenals/Urinary Tract: Kidneys appear somewhat atrophic and diminutive in size. Kidneys are otherwise unremarkable, without renal calculi, suspicious lesion, or hydronephrosis. Bladder is grossly unremarkable though difficult to assess fully given extensive streak artifact in the pelvis. Stomach/Bowel: Distal esophagus stomach and duodenal sweep are unremarkable. No small bowel thickening or dilatation. The appendix is not well visualized as the cecal tip is partially obscured by streak artifact. Mild thickening of the hepatic flexure, likely reactive. Distal colon has a normal appearance. Vascular/Lymphatic: Atherosclerotic plaque within the normal caliber aorta. No suspicious or  enlarged lymph nodes in the included lymphatic chains. Reproductive: Uterus is surgically absent. No visible adnexal lesions though the pelvis is largely obscured by streak artifact. Other: No abdominopelvic free air. Reactive free fluid tracking along the attic tip and towards the right paracolic gutter likely tracking from the gallbladder fossa. No bowel containing hernias. Musculoskeletal: Multilevel degenerative changes are present in the imaged portions of the spine. No acute osseous abnormality or suspicious osseous lesion. Total left hip arthroplasty and  plate and screw construct of the left ilium results in extensive streak artifact which limits details of the adjacent osseous structures and soft tissues of the pelvis. IMPRESSION: 1. Cholelithiasis with gallbladder wall thickening and pericholecystic fluid tracking throughout the gallbladder fossa and towards the tip of the liver, consistent with acute cholecystitis. 2. No mural calcification of the gallbladder. 3. Mild thickening of the hepatic flexure, likely reactive. 4. Evaluation of the pelvis limited by extensive streak artifact from patient's left hip arthroplasty and iliac plate screw construct. 5. Aortic Atherosclerosis (ICD10-I70.0). Electronically Signed   By: Lovena Le M.D.   On: 09/15/2018 18:13     Time spent: 35 minutes  Author: Berle Mull, MD [facility] 09/16/2018 5:31 PM  To reach On-call, see care teams to locate the attending and reach out to them via [email or web address]. If 7PM-7AM, please contact night-coverage If you still have difficulty reaching the attending provider, please page the [facility] (Director on Call) for [facility] on amion for assistance.

## 2018-09-16 NOTE — Progress Notes (Signed)
Still no response from GI PA so paged again. Will continue to monitor

## 2018-09-16 NOTE — Progress Notes (Signed)
Paged Surgical PA about MRI machine being down, still no call back.  Dr. Posey Pronto aware

## 2018-09-16 NOTE — Consult Note (Signed)
[place] Reason for consult: Hematemesis, elevated LFTs Referring Physician: [facility] hospitalist.  PCP:Dr Macarthur Critchley in [place].  Primary GI: Dr. Candy Sledge is an 85 y.o. female.  HPI: She was seen by Dr. Cristina Gong 2 years ago when hospitalized and was found to have gastric ulcers because of the GI bleed.  These were treated and she had a subsequent EGD at [facility] endoscopy unit documenting healing of the ulcers.  According to the daughter who is giving a lot of the history today, she has been on Protonix ever since for reflux.  The patient has had 2 episodes of hematemesis of dark material and dark stool over the past 10 days.  In between these 2 episodes she has had right upper quadrant pain and nausea with minimal vomiting.  Some of the vomiting apparently was not dark.  Her pain became worse and she was brought to the hospital.  She was found to be hypotensive with elevated WBC and stool positive for blood.  She was tachycardiac and work-up showed TB 5.7 with elevated LFTs and WBC 18.2.  Ultrasound showed cholelithiasis and probable cholecystitis without ductal dilatation.  CT confirmed gallbladder wall thickening and some increased pericholecystic fluid consistent with acute cholecystitis.  The patient was started on Zosyn and her WBC is improved and she clinically feels better.  She has had no further hematemesis.  Hemoglobin was 11.6 on admission and after aggressive IV fluids has dropped somewhat to 9.5.  Due to persistent elevation of her bilirubin and MRCP has been ordered and is still pending at this time.  Past Medical History:  Diagnosis Date  . Arthritis   . Back pain   . Cancer ([place])   . GERD (gastroesophageal reflux disease)   . Gout   . Gout   . History of left foot drop   . HTN (hypertension)   . Neuropathy   . Osteoarthritis   . Pneumonia   . Renal insufficiency     Past Surgical History:  Procedure Laterality Date  . ABDOMINAL HYSTERECTOMY     . CATARACT EXTRACTION, BILATERAL    . EPIDURAL STEROID INJECTION    . ESOPHAGOGASTRODUODENOSCOPY (EGD) WITH PROPOFOL N/A 02/27/2016   Procedure: ESOPHAGOGASTRODUODENOSCOPY (EGD) WITH PROPOFOL;  Surgeon: Ronald Lobo, MD;  Location: [facility] ENDOSCOPY;  Service: Endoscopy;  Laterality: N/A;  . MOHS SURGERY    . right carpal tunnel release    . TONSILLECTOMY    . TOTAL HIP ARTHROPLASTY      Family History  Problem Relation Age of Onset  . Rheumatologic disease Mother   . Diabetes Mother     Social History:  reports that she has never smoked. She has never used smokeless tobacco. She reports that she does not drink alcohol or use drugs.  Allergies:  Allergies  Allergen Reactions  . Aspirin Other (See Comments)    Reaction unknown  . Nsaids Other (See Comments)    Bleeding, skin hemorrhages  . Silver Rash    Medications; Prior to Admission medications   Medication Sig Start Date End Date Taking? Authorizing Provider  amLODipine (NORVASC) 5 MG tablet Take 5 mg by mouth daily. 02/10/16  Yes [provider]  Iron Combinations (NIFEREX) TABS Take 150 mg by mouth daily. 02/28/16  Yes Barton Dubois, MD  mirabegron ER (MYRBETRIQ) 50 MG TB24 tablet Take 50 mg by mouth daily.   Yes [provider]  Multiple Vitamin (MULTI-VITAMINS) TABS Take 1 tablet by mouth daily. 03/03/07  Yes [provider]  pantoprazole (PROTONIX) 40 MG tablet Take 1 tablet (40 mg total) by mouth 2 (two) times daily. 02/28/16  Yes Barton Dubois, MD  Polyethyl Glycol-Propyl Glycol (SYSTANE OP) Place 1 drop into both eyes 2 (two) times daily as needed (For dry eyes.).   Yes [provider]  pregabalin (LYRICA) 75 MG capsule Take 75 mg by mouth 2 (two) times daily.   Yes [provider]  acetaminophen (TYLENOL) 500 MG tablet Take 2 tablets (1,000 mg total) by mouth every 8 (eight) hours as needed for mild pain, fever or headache. Patient not taking: Reported on 09/15/2018 02/28/16    Barton Dubois, MD  traMADol-acetaminophen (ULTRACET) 37.5-325 MG tablet Take 1 tablet by mouth every 8 (eight) hours as needed for severe pain. Patient not taking: Reported on 09/15/2018 02/28/16   Barton Dubois, MD   . Chlorhexidine Gluconate Cloth  6 each Topical Daily  . mirabegron ER  50 mg Oral Daily  . sodium chloride flush  3 mL Intravenous Q12H   PRN Meds acetaminophen **OR** acetaminophen, ondansetron **OR** ondansetron (ZOFRAN) IV Results for orders placed or performed during the hospital encounter of 09/15/18 (from the past 48 hour(s))  Comprehensive metabolic panel     Status: Abnormal   Collection Time: 09/15/18 12:54 PM  Result Value Ref Range   Sodium 140 135 - 145 mmol/L   Potassium 3.4 (L) 3.5 - 5.1 mmol/L   Chloride 108 98 - 111 mmol/L   CO2 22 22 - 32 mmol/L   Glucose, Bld 155 (H) 70 - 99 mg/dL   BUN 35 (H) 8 - 23 mg/dL   Creatinine, Ser 2.06 (H) 0.44 - 1.00 mg/dL   Calcium 8.5 (L) 8.9 - 10.3 mg/dL   Total Protein 6.7 6.5 - 8.1 g/dL   Albumin 3.0 (L) 3.5 - 5.0 g/dL   AST 177 (H) 15 - 41 U/L   ALT 130 (H) 0 - 44 U/L   Alkaline Phosphatase 271 (H) 38 - 126 U/L   Total Bilirubin 5.7 (H) 0.3 - 1.2 mg/dL   GFR calc non Af Amer 21 (L) >60 mL/min   GFR calc Af Amer 24 (L) >60 mL/min   Anion gap 10 5 - 15    Comment: Performed at [facility], [place] [address], [place], [place] [address]  CBC WITH DIFFERENTIAL     Status: Abnormal   Collection Time: 09/15/18 12:54 PM  Result Value Ref Range   WBC 18.2 (H) 4.0 - 10.5 K/uL   RBC 3.74 (L) 3.87 - 5.11 MIL/uL   Hemoglobin 11.6 (L) 12.0 - 15.0 g/dL   HCT 35.1 (L) 36.0 - 46.0 %   MCV 93.9 80.0 - 100.0 fL   MCH 31.0 26.0 - 34.0 pg   MCHC 33.0 30.0 - 36.0 g/dL   RDW 13.3 11.5 - 15.5 %   Platelets 286 150 - 400 K/uL   nRBC 0.0 0.0 - 0.2 %   Neutrophils Relative % 89 %   Neutro Abs 16.2 (H) 1.7 - 7.7 K/uL   Lymphocytes Relative 5 %   Lymphs Abs 1.0 0.7 - 4.0 K/uL   Monocytes Relative 5 %   Monocytes  Absolute 0.9 0.1 - 1.0 K/uL   Eosinophils Relative 0 %   Eosinophils Absolute 0.0 0.0 - 0.5 K/uL   Basophils Relative 0 %   Basophils Absolute 0.0 0.0 - 0.1 K/uL   Immature Granulocytes 1 %   Abs Immature Granulocytes 0.16 (H) 0.00 - 0.07 K/uL  Comment: Performed at [facility], [place] [address], [place], [place] [address]  Protime-INR     Status: None   Collection Time: 09/15/18 12:54 PM  Result Value Ref Range   Prothrombin Time 14.4 11.4 - 15.2 seconds   INR 1.1 0.8 - 1.2    Comment: (NOTE) INR goal varies based on device and disease states. Performed at [facility], [place] [address] [place], [place] [address]   Type and screen [place]     Status: None   Collection Time: 09/15/18 12:54 PM  Result Value Ref Range   ABO/RH(D) O POS    Antibody Screen NEG    Sample Expiration      09/18/2018,2359 Performed at [facility], [place] [address] [place], [place] [address]   Lipase, blood     Status: None   Collection Time: 09/15/18 12:54 PM  Result Value Ref Range   Lipase 27 11 - 51 U/L    Comment: Performed at [facility] [place] [facility], [place] [address] [place], [place] [address]  POC occult blood, ED     Status: Abnormal   Collection Time: 09/15/18  1:16 PM  Result Value Ref Range   Fecal Occult Bld POSITIVE (A) NEGATIVE  Lactic acid, plasma     Status: None   Collection Time: 09/15/18  5:21 PM  Result Value Ref Range   Lactic Acid, Venous 1.1 0.5 - 1.9 mmol/L    Comment: Performed at [facility], [place] [address], [place], [place] [address]  SARS Coronavirus 2 [facility] order, Performed in [facility] hospital lab) Nasopharyngeal Nasopharyngeal Swab     Status: None   Collection Time: 09/15/18  6:50 PM   Specimen: Nasopharyngeal Swab  Result Value Ref Range   SARS Coronavirus 2 NEGATIVE NEGATIVE    Comment: (NOTE) If result is NEGATIVE SARS-CoV-2 target nucleic acids  are NOT DETECTED. The SARS-CoV-2 RNA is generally detectable in upper and lower  respiratory specimens during the acute phase of infection. The lowest  concentration of SARS-CoV-2 viral copies this assay can detect is 250  copies / mL. A negative result does not preclude SARS-CoV-2 infection  and should not be used as the sole basis for treatment or other  patient management decisions.  A negative result may occur with  improper specimen collection / handling, submission of specimen other  than nasopharyngeal swab, presence of viral mutation(s) within the  areas targeted by this assay, and inadequate number of viral copies  (<250 copies / mL). A negative result must be combined with clinical  observations, patient history, and epidemiological information. If result is POSITIVE SARS-CoV-2 target nucleic acids are DETECTED. The SARS-CoV-2 RNA is generally detectable in upper and lower  respiratory specimens dur ing the acute phase of infection.  Positive  results are indicative of active infection with SARS-CoV-2.  Clinical  correlation with patient history and other diagnostic information is  necessary to determine patient infection status.  Positive results do  not rule out bacterial infection or co-infection with other viruses. If result is PRESUMPTIVE POSTIVE SARS-CoV-2 nucleic acids MAY BE PRESENT.   A presumptive positive result was obtained on the submitted specimen  and confirmed on repeat testing.  While 2019 novel coronavirus  (SARS-CoV-2) nucleic acids may be present in the submitted sample  additional confirmatory testing may be necessary for epidemiological  and / or clinical management purposes  to differentiate between  SARS-CoV-2 and other Sarbecovirus currently known  to infect humans.  If clinically indicated additional testing with an alternate test  methodology [phone]) is advised. The SARS-CoV-2 RNA is generally  detectable in upper and lower respiratory sp ecimens  during the acute  phase of infection. The expected result is Negative. Fact Sheet for Patients:  StrictlyIdeas.no Fact Sheet for Healthcare Providers: BankingDealers.co.za This test is not yet approved or cleared by the [place] FDA and has been authorized for detection and/or diagnosis of SARS-CoV-2 by FDA under an Emergency Use Authorization (EUA).  This EUA will remain in effect (meaning this test can be used) for the duration of the COVID-19 declaration under Section 564(b)(1) of the Act, 21 U.S.C. section 360bbb-3(b)(1), unless the authorization is terminated or revoked sooner. Performed at [facility], [place] [address], [place], [place] [address]   MRSA PCR Screening     Status: None   Collection Time: 09/15/18  9:43 PM   Specimen: Nasal Mucosa; Nasopharyngeal  Result Value Ref Range   MRSA by PCR NEGATIVE NEGATIVE    Comment:        The GeneXpert MRSA Assay (FDA approved for NASAL specimens only), is one component of a comprehensive MRSA colonization surveillance program. It is not intended to diagnose MRSA infection nor to guide or monitor treatment for MRSA infections. Performed at [facility], [place] [address], [place], [place] [address]   Comprehensive metabolic panel     Status: Abnormal   Collection Time: 09/16/18  1:54 AM  Result Value Ref Range   Sodium 144 135 - 145 mmol/L   Potassium 3.2 (L) 3.5 - 5.1 mmol/L   Chloride 116 (H) 98 - 111 mmol/L   CO2 18 (L) 22 - 32 mmol/L   Glucose, Bld 112 (H) 70 - 99 mg/dL   BUN 35 (H) 8 - 23 mg/dL   Creatinine, Ser 1.51 (H) 0.44 - 1.00 mg/dL   Calcium 8.0 (L) 8.9 - 10.3 mg/dL   Total Protein 5.6 (L) 6.5 - 8.1 g/dL   Albumin 2.6 (L) 3.5 - 5.0 g/dL   AST 103 (H) 15 - 41 U/L   ALT 101 (H) 0 - 44 U/L   Alkaline Phosphatase 198 (H) 38 - 126 U/L   Total Bilirubin 5.0 (H) 0.3 - 1.2 mg/dL   GFR calc non Af Amer 31 (L) >60 mL/min   GFR  calc Af Amer 35 (L) >60 mL/min   Anion gap 10 5 - 15    Comment: Performed at [facility], [place] [address] [place], [place] [address]  CBC     Status: Abnormal   Collection Time: 09/16/18  1:54 AM  Result Value Ref Range   WBC 10.4 4.0 - 10.5 K/uL   RBC 3.03 (L) 3.87 - 5.11 MIL/uL   Hemoglobin 9.5 (L) 12.0 - 15.0 g/dL   HCT 28.0 (L) 36.0 - 46.0 %   MCV 92.4 80.0 - 100.0 fL   MCH 31.4 26.0 - 34.0 pg   MCHC 33.9 30.0 - 36.0 g/dL   RDW 13.5 11.5 - 15.5 %   Platelets 175 150 - 400 K/uL   nRBC 0.0 0.0 - 0.2 %    Comment: Performed at [facility], [place] [address] [place], [place] [address]  Magnesium     Status: None   Collection Time: 09/16/18  1:54 AM  Result Value Ref Range   Magnesium 1.8 1.7 - 2.4 mg/dL    Comment: Performed at [facility], [place]  [address] [place], [place] [address]  Glucose, capillary     Status: None   Collection Time: 09/16/18  7:48 AM  Result Value Ref Range   Glucose-Capillary 99 70 - 99 mg/dL   Comment 1 Notify RN    Comment 2 Document in Chart     Ct Abdomen Pelvis Wo Contrast  Result Date: 09/15/2018 CLINICAL DATA:  Cholelithiasis, porcelain gallbladder ultrasound EXAM: CT ABDOMEN AND PELVIS WITHOUT CONTRAST TECHNIQUE: Multidetector CT imaging of the abdomen and pelvis was performed following the standard protocol without IV contrast. COMPARISON:  Same-day ultrasound the right upper quadrant FINDINGS: Lower chest: Bandlike areas of basilar atelectasis and/or scarring. Cardiomegaly. Calcification of the aortic leaflets. Coronary calcifications. Hepatobiliary: No focal liver lesion. The gallbladder is partially decompressed around several calcified gallstones. The wall appears thickened and edematous but without mural calcification. There is pericholecystic fluid in inflammation tracking throughout the gallbladder fossa and towards the tip of the liver. No visible biliary ductal dilatation or calcified  intraductal gallstones. Pancreas: Fatty replacement of the pancreas. No pancreatic ductal dilatation or surrounding inflammatory changes. Spleen: This Normal in size without focal abnormality. Adrenals/Urinary Tract: Kidneys appear somewhat atrophic and diminutive in size. Kidneys are otherwise unremarkable, without renal calculi, suspicious lesion, or hydronephrosis. Bladder is grossly unremarkable though difficult to assess fully given extensive streak artifact in the pelvis. Stomach/Bowel: Distal esophagus stomach and duodenal sweep are unremarkable. No small bowel thickening or dilatation. The appendix is not well visualized as the cecal tip is partially obscured by streak artifact. Mild thickening of the hepatic flexure, likely reactive. Distal colon has a normal appearance. Vascular/Lymphatic: Atherosclerotic plaque within the normal caliber aorta. No suspicious or enlarged lymph nodes in the included lymphatic chains. Reproductive: Uterus is surgically absent. No visible adnexal lesions though the pelvis is largely obscured by streak artifact. Other: No abdominopelvic free air. Reactive free fluid tracking along the attic tip and towards the right paracolic gutter likely tracking from the gallbladder fossa. No bowel containing hernias. Musculoskeletal: Multilevel degenerative changes are present in the imaged portions of the spine. No acute osseous abnormality or suspicious osseous lesion. Total left hip arthroplasty and plate and screw construct of the left ilium results in extensive streak artifact which limits details of the adjacent osseous structures and soft tissues of the pelvis. IMPRESSION: 1. Cholelithiasis with gallbladder wall thickening and pericholecystic fluid tracking throughout the gallbladder fossa and towards the tip of the liver, consistent with acute cholecystitis. 2. No mural calcification of the gallbladder. 3. Mild thickening of the hepatic flexure, likely reactive. 4. Evaluation of the  pelvis limited by extensive streak artifact from patient's left hip arthroplasty and iliac plate screw construct. 5. Aortic Atherosclerosis (ICD10-I70.0). Electronically Signed   By: Lovena Le M.D.   On: 09/15/2018 18:13   Dg Chest Port 1 View  Result Date: 09/15/2018 CLINICAL DATA:  Fever and vomiting today. EXAM: PORTABLE CHEST 1 VIEW COMPARISON:  None. FINDINGS: Fullness of the SUPERIOR mediastinum is noted, question goiter. The remainder of the cardiomediastinal silhouette is unremarkable. There is no evidence of focal airspace disease, pulmonary edema, suspicious pulmonary nodule/mass, pleural effusion, or pneumothorax. No acute bony abnormalities are identified. IMPRESSION: 1. No evidence of acute cardiopulmonary disease. 2. Fullness of the SUPERIOR mediastinum which may represent enlarged thyroid gland. Consider ultrasound for further evaluation. Electronically Signed   By: Margarette Canada M.D.   On: 09/15/2018 17:31   US Abdomen Limited Ruq  Result Date: 09/15/2018 CLINICAL DATA:  85 year old female with elevated LFTs.  EXAM: ULTRASOUND ABDOMEN LIMITED RIGHT UPPER QUADRANT COMPARISON:  None. FINDINGS: Gallbladder: Multiple mobile gallstones are identified, the largest measuring 1.1 cm. Gallbladder wall thickening is present. There is no evidence of pericholecystic fluid or sonographic Murphy sign. Apparent gallbladder wall calcifications noted. Common bile duct: Diameter: 3.3 mm. No evidence of intrahepatic or extrahepatic biliary dilatation. Liver: Increased hepatic echogenicity noted. No focal hepatic abnormalities are noted. Portal vein is patent on color Doppler imaging with normal direction of blood flow towards the liver. Other: None. IMPRESSION: 1. Cholelithiasis with gallbladder wall thickening but without pericholecystic fluid or sonographic Murphy sign - equivocal for acute cholecystitis. 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 2. No evidence of biliary dilatation. 3. Hepatic steatosis Electronically Signed   By: Margarette Canada M.D.   On: 09/15/2018 15:40              Blood pressure (!) 136/52, pulse 91, temperature 98.3 F (36.8 C), temperature source Oral, resp. rate 19, height 5\' 5"  (1.651 m), weight 71 kg, SpO2 96 %.  Physical exam:   General--Pleasant white female no acute distress  ENT--slightly icteric Neck--supple Heart--regular rate and rhythm without murmurs or gallops Lungs--clear Abdomen--obese with good bowel sounds tender in the right upper quadrant Psych--alert and oriented   Assessment: 1.  Abdominal pain right upper quadrant.  CT and ultrasound are consistent with acute cholecystitis and the patient appears to be improving on IV antibiotics.  MRCP is pending to rule out CBD stone since TB remains elevated. 2.  Hematemesis/stool positive for FOB.  The patient has been taking Protonix regularly as an outpatient for reflux.  This was confirmed with the daughter.  It is possible she could have developed an ulcer while on this medicine but this is unlikely.  The gastric ulcers that she had 2 years ago were documented to have healed.  Plan: 1.  We will follow with you.  Depending on the results of MRCP she may need either ERCP or EGD prior to consideration for cholecystectomy.  Dr Alessandra Bevels will be seeing over the weekend.   Nancy Fetter 09/16/2018, 12:18 PM   This note was created using voice recognition software and minor errors may Have occurred unintentionally. Pager: [phone] If no answer or after hours call [phone]

## 2018-09-16 NOTE — Progress Notes (Signed)
Was notified by MRI that machine is currently down and they would have to get back with [place] about pt's ordered MRI of abdomen.  Made Dr. Posey Pronto aware of pt's delay for test and will keep updated

## 2018-09-16 NOTE — Progress Notes (Signed)
Notified Surgery's office and connected to Dr. Marlou Starks who called back per his OR nurse.  Attempted to see if we needed to have the MRCP done at [facility] or if we needed to wait for tomorrow for [facility] machine.  Nurse explained they were in the middle of surgery and could not "do this right now." Asked for this RN to call primary.  Notified CN Christian and attempted to call over to MRI at [facility]. Was connected to radiology and then sent to another person which never picked up.  Will try again and continue to monitor

## 2018-09-16 NOTE — Progress Notes (Signed)
CC: Abdominal pain nausea and vomiting  Subjective: She seems fairly comfortable this a.m.  She has some tenderness in the right upper quadrant with palpation.  No acute distress.  No further indication additional GI bleed.  Objective: Vital signs in last 24 hours: Temp:  [98.3 F (36.8 C)-99 F (37.2 C)] 98.3 F (36.8 C) (08/28 0810) Pulse Rate:  [81-106] 91 (08/28 0800) Resp:  [14-22] 22 (08/28 0800) BP: (80-157)/(43-60) 138/50 (08/28 0800) SpO2:  [94 %-98 %] 97 % (08/28 0800) Weight:  [71 kg-73.5 kg] 71 kg (08/28 0500) Last BM Date: 09/15/18 N.p.o. 2674 IV 700 urine recorded No BM recorded Afebrile vital signs are stable. Creatinine 2.06>> 1.51 Alk phos 271>> 198 Total bilirubin 5.7>> 5.0 AST 177>> 103 ALT 130>> 101  WBC 18.2>> 10.4 H/H 11.6/35>> 9.5/28(8/28) Fecal occult +09/15/2018 COVID -09/15/2018  Abdominal ultrasound 8/27: Multiple gallstones are identified the largest being 1.1 cm.  Gallbladder wall thickening is present but no evidence of pericholecystic fluid.  Gallbladder wall calcification, common bile duct 3.3 mm.  CT abdomen pelvis without contrast 8/28: Bandlike atelectasis lower chest.  Liver showed no focal lesions, bladder was decompressed with several calcified gallstones which the wall appeared thickened and edematous without mural calcification there is pericholecystic fluid and inflammation tracking throughout the gallbladder fossa toward the tip of the liver.  No visible ductal dilatation or calcified intraductal gallstones. Intake/Output from previous day: 08/27 0701 - 08/28 0700 In: 2674 [I.V.:574; IV Piggyback:2100] Out: 700 [Urine:700] Intake/Output this shift: Total I/O In: 553.3 [I.V.:522.9; IV Piggyback:30.4] Out: 350 [Urine:350]  General appearance: alert, cooperative and no distress Resp: clear to auscultation bilaterally GI: Soft, some tenderness in the right upper quadrant.  Lab Results:  Recent Labs    09/15/18 1254  09/16/18 0154  WBC 18.2* 10.4  HGB 11.6* 9.5*  HCT 35.1* 28.0*  PLT 286 175    BMET Recent Labs    09/15/18 1254 09/16/18 0154  NA 140 144  K 3.4* 3.2*  CL 108 116*  CO2 22 18*  GLUCOSE 155* 112*  BUN 35* 35*  CREATININE 2.06* 1.51*  CALCIUM 8.5* 8.0*   PT/INR Recent Labs    09/15/18 1254  LABPROT 14.4  INR 1.1    Recent Labs  Lab 09/15/18 1254 09/16/18 0154  AST 177* 103*  ALT 130* 101*  ALKPHOS 271* 198*  BILITOT 5.7* 5.0*  PROT 6.7 5.6*  ALBUMIN 3.0* 2.6*     Lipase     Component Value Date/Time   LIPASE 27 09/15/2018 1254     Medications: . Chlorhexidine Gluconate Cloth  6 each Topical Daily  . mirabegron ER  50 mg Oral Daily  . sodium chloride flush  3 mL Intravenous Q12H   . lactated ringers 100 mL/hr at 09/16/18 0809  . pantoprozole (PROTONIX) infusion 8 mg/hr (09/16/18 0011)  . piperacillin-tazobactam (ZOSYN)  IV Stopped (09/16/18 2952)  . potassium chloride 10 mEq (09/16/18 8413)    Assessment/Plan HTN hypotension CKD with AKI - Cr 2.06 Prior upper GI bleed  Hematemesis and melena - Fecal occult +. Hgb 11.6. Per GI and medicine. Hx prior GI bleed Chronic neuropathy pain COVID -09/15/2018 Memory loss -patient says she can begin a sentence but forgets what she was talking about by the end of a sentence at times.  RUQ abdominal pain, N/V, Hx coffee-ground emesis/melena Possible cholecystitis - [place] w/ gallstones and gallbladder wall thickening.  There is question of a porcelain gallbladder. - WBC 18.2.   - Alk  phos 271, AST 177, ALT 130, T bili 5.7.  Lipase 27.  -Recommend obtaining CT scan to further evaluate ultrasound findings of porcelain gallbladder.  -Recommend GI consultation for elevated LFTs.  T bili is elevated at 5.7 with common bile duct within normal limits.  -We will follow-up on CT scan with further recommendations -Leave n.p.o. -Agree with IV antibiotics -We will follow  FEN: N.p.o./IV fluids ID: Zosyn 8/27 >> day  2 DVT: SCDs only -GI bleed Follow-up: TBD POC: Geralds,JAMES  [place]  Kail[place], Starry Daughter [phone]  [phone]     Plan: GI consult -Dr. Laurence Spates to see later today.  We have ordered an MRCP.  Dr. Marlou Starks discussed possible treatments including surgery, medical management, and possible drain placement for cholecystitis with interval cholecystectomy later.  We have ordered an MRCP secondary to her elevated LFTs/bilirubin of 5.  We will follow with you.  LOS: 1 day    Madisen Ludvigsen 09/16/2018 [phone]

## 2018-09-17 ENCOUNTER — Inpatient Hospital Stay (HOSPITAL_COMMUNITY): Payer: Medicare Other

## 2018-09-17 ENCOUNTER — Ambulatory Visit (HOSPITAL_COMMUNITY): Admit: 2018-09-17 | Payer: Medicare Other

## 2018-09-17 ENCOUNTER — Ambulatory Visit (HOSPITAL_COMMUNITY)
Admit: 2018-09-17 | Discharge: 2018-09-17 | Disposition: A | Discharge status: Home / Self Care | Payer: Medicare Other | Attending: Internal Medicine | Admitting: Internal Medicine

## 2018-09-17 LAB — CBC WITH DIFFERENTIAL/PLATELET
Abs Immature Granulocytes: 0.12 10*3/uL — ABNORMAL HIGH (ref 0.00–0.07)
Basophils Absolute: 0 10*3/uL (ref 0.0–0.1)
Basophils Relative: 0 %
Eosinophils Absolute: 0.1 10*3/uL (ref 0.0–0.5)
Eosinophils Relative: 1 %
HCT: 29.7 % — ABNORMAL LOW (ref 36.0–46.0)
Hemoglobin: 9.5 g/dL — ABNORMAL LOW (ref 12.0–15.0)
Immature Granulocytes: 1 %
Lymphocytes Relative: 11 %
Lymphs Abs: 1.2 10*3/uL (ref 0.7–4.0)
MCH: 30.6 pg (ref 26.0–34.0)
MCHC: 32 g/dL (ref 30.0–36.0)
MCV: 95.8 fL (ref 80.0–100.0)
Monocytes Absolute: 0.4 10*3/uL (ref 0.1–1.0)
Monocytes Relative: 4 %
Neutro Abs: 9 10*3/uL — ABNORMAL HIGH (ref 1.7–7.7)
Neutrophils Relative %: 83 %
Platelets: 236 10*3/uL (ref 150–400)
RBC: 3.1 MIL/uL — ABNORMAL LOW (ref 3.87–5.11)
RDW: 13.8 % (ref 11.5–15.5)
WBC: 10.8 10*3/uL — ABNORMAL HIGH (ref 4.0–10.5)
nRBC: 0 % (ref 0.0–0.2)

## 2018-09-17 LAB — COMPREHENSIVE METABOLIC PANEL WITH GFR
ALT: 70 U/L — ABNORMAL HIGH (ref 0–44)
AST: 47 U/L — ABNORMAL HIGH (ref 15–41)
Albumin: 2.6 g/dL — ABNORMAL LOW (ref 3.5–5.0)
Alkaline Phosphatase: 181 U/L — ABNORMAL HIGH (ref 38–126)
Anion gap: 10 (ref 5–15)
BUN: 22 mg/dL (ref 8–23)
CO2: 20 mmol/L — ABNORMAL LOW (ref 22–32)
Calcium: 8.4 mg/dL — ABNORMAL LOW (ref 8.9–10.3)
Chloride: 111 mmol/L (ref 98–111)
Creatinine, Ser: 1.16 mg/dL — ABNORMAL HIGH (ref 0.44–1.00)
GFR calc Af Amer: 49 mL/min — ABNORMAL LOW
GFR calc non Af Amer: 42 mL/min — ABNORMAL LOW
Glucose, Bld: 90 mg/dL (ref 70–99)
Potassium: 3.8 mmol/L (ref 3.5–5.1)
Sodium: 141 mmol/L (ref 135–145)
Total Bilirubin: 2.7 mg/dL — ABNORMAL HIGH (ref 0.3–1.2)
Total Protein: 5.8 g/dL — ABNORMAL LOW (ref 6.5–8.1)

## 2018-09-17 MED ORDER — MECLIZINE HCL 25 MG PO TABS
25.0000 mg | ORAL_TABLET | Freq: Three times a day (TID) | ORAL | Status: DC
  Administered 2018-09-17 – 2018-09-18 (×2): 25 mg via ORAL
  Filled 2018-09-17 (×3): qty 1

## 2018-09-17 MED ORDER — LORAZEPAM 2 MG/ML IJ SOLN
0.5000 mg | Freq: Once | INTRAMUSCULAR | Status: AC
  Administered 2018-09-17: 0.5 mg via INTRAVENOUS
  Filled 2018-09-17: qty 1

## 2018-09-17 MED ORDER — METOCLOPRAMIDE HCL 5 MG/ML IJ SOLN
10.0000 mg | Freq: Once | INTRAMUSCULAR | Status: AC
  Administered 2018-09-17: 10 mg via INTRAVENOUS
  Filled 2018-09-17: qty 2

## 2018-09-17 MED ORDER — PANTOPRAZOLE SODIUM 40 MG IV SOLR
40.0000 mg | Freq: Two times a day (BID) | INTRAVENOUS | Status: DC
  Administered 2018-09-17 (×2): 40 mg via INTRAVENOUS
  Filled 2018-09-17 (×2): qty 40

## 2018-09-17 MED ORDER — METOPROLOL TARTRATE 5 MG/5ML IV SOLN
5.0000 mg | Freq: Once | INTRAVENOUS | Status: AC
  Administered 2018-09-17: 5 mg via INTRAVENOUS

## 2018-09-17 MED ORDER — QUETIAPINE FUMARATE 25 MG PO TABS
12.5000 mg | ORAL_TABLET | Freq: Every day | ORAL | Status: DC
  Administered 2018-09-17 – 2018-09-18 (×2): 12.5 mg via ORAL
  Filled 2018-09-17 (×2): qty 1

## 2018-09-17 MED ORDER — METOPROLOL TARTRATE 5 MG/5ML IV SOLN
INTRAVENOUS | Status: AC
  Administered 2018-09-17: 03:00:00
  Filled 2018-09-17: qty 5

## 2018-09-17 NOTE — Progress Notes (Signed)
[facility]:  [phone] General Surgery Progress Note   LOS: 2 days  POD -     Chief Complaint: Abdominal pain  Assessment and Plan: 1.  Cholecystitis, cholelithiasis  Zosyn  Clarify GI bleed with upper endoscopy - then either antibiotic treatment of gall bladder disease or gall bladder surgery during this admission.  I spoke to daughter, [name], about the options.   2.  Elevated bilirubin  Down to 2.7 - 09/17/2018   Mild elevation of LFT's  MRCP - 09/17/2018  1. Severely degraded exam, as detailed above.  2. Cholelithiasis with focal gallbladder wall thickening and mild gallbladder distension. Findings remain equivocal for acute cholecystitis.  3. No biliary duct dilatation or choledocholithiasis.  4. Hepatomegaly.  5. Bibasilar airspace disease, which may be progressive, especially on the left. Correlate with symptoms to suggest ongoing infection or aspiration.  3.  GI bleed  Hgb - 9.5 - 09/17/2018  On IV protonix  For upper endo - but delayed until tomorrow - Dr. Alessandra Bevels 4.  CKD  Creatinine - 1.16 - 09/17/2018 5.  Chronic neuropathy pain 6.  DVT prophylaxis - on hold for potential GI bleed 7.  Early dementia 8.  History of left hip surgery with left leg injury in 2009  Wears a brace for her left leg and uses a walker   Principal Problem:   Acute upper GI bleed Active Problems:   AKI (acute kidney injury) ([place])   Benign essential HTN   Acute cholecystitis   Transaminitis  Subjective:  Can not give good history.   Somewhat confused.    Most of my history came from the daughter - [name].  I met [name] at the bedside and reviewed the finding. [name] also has a son.   Her husband died in 02/11/2018 from pneumonia after a long illness.  [name] lives in [place], [place].  Her PCP is Dr. Earney Mallet.  She is to see a neurologist, Dr. Volanda Napoleon, in [place], about dementia.  Objective:   Vitals:   09/17/18 0240 09/17/18 0607  BP:  (!) 145/73 (!) 141/72  Pulse: 88 82  Resp: 16 16  Temp: 98.8 F (37.1 C) 98.9 F (37.2 C)  SpO2: 100% 97%     Intake/Output from previous day:  08/28 0701 - 08/29 0700 In: 3616 [P.O.:100; I.V.:2920.5; IV Piggyback:595.5] Out: 3250 [Urine:3250]  Intake/Output this shift:  No intake/output data recorded.   Physical Exam:   General: Older WF who is alert.  She has a tremor    HEENT: Normal. Pupils equal. .   Lungs: Clear.   Abdomen: Soft.  No focal tenderness.   Lab Results:    Recent Labs    09/16/18 0154 09/17/18 0355  WBC 10.4 10.8*  HGB 9.5* 9.5*  HCT 28.0* 29.7*  PLT 175 236    BMET   Recent Labs    09/16/18 0154 09/17/18 0355  NA 144 141  K 3.2* 3.8  CL 116* 111  CO2 18* 20*  GLUCOSE 112* 90  BUN 35* 22  CREATININE 1.51* 1.16*  CALCIUM 8.0* 8.4*    PT/INR   Recent Labs    09/15/18 1254  LABPROT 14.4  INR 1.1    ABG  No results for input(s): PHART, HCO3 in the last 72 hours.  Invalid input(s): PCO2, PO2   Studies/Results:  Ct Abdomen Pelvis Wo Contrast  Result Date: 09/15/2018 CLINICAL DATA:  Cholelithiasis, porcelain gallbladder ultrasound EXAM: CT ABDOMEN AND PELVIS WITHOUT CONTRAST  TECHNIQUE: Multidetector CT imaging of the abdomen and pelvis was performed following the standard protocol without IV contrast. COMPARISON:  Same-day ultrasound the right upper quadrant FINDINGS: Lower chest: Bandlike areas of basilar atelectasis and/or scarring. Cardiomegaly. Calcification of the aortic leaflets. Coronary calcifications. Hepatobiliary: No focal liver lesion. The gallbladder is partially decompressed around several calcified gallstones. The wall appears thickened and edematous but without mural calcification. There is pericholecystic fluid in inflammation tracking throughout the gallbladder fossa and towards the tip of the liver. No visible biliary ductal dilatation or calcified intraductal gallstones. Pancreas: Fatty replacement of the pancreas. No  pancreatic ductal dilatation or surrounding inflammatory changes. Spleen: This Normal in size without focal abnormality. Adrenals/Urinary Tract: Kidneys appear somewhat atrophic and diminutive in size. Kidneys are otherwise unremarkable, without renal calculi, suspicious lesion, or hydronephrosis. Bladder is grossly unremarkable though difficult to assess fully given extensive streak artifact in the pelvis. Stomach/Bowel: Distal esophagus stomach and duodenal sweep are unremarkable. No small bowel thickening or dilatation. The appendix is not well visualized as the cecal tip is partially obscured by streak artifact. Mild thickening of the hepatic flexure, likely reactive. Distal colon has a normal appearance. Vascular/Lymphatic: Atherosclerotic plaque within the normal caliber aorta. No suspicious or enlarged lymph nodes in the included lymphatic chains. Reproductive: Uterus is surgically absent. No visible adnexal lesions though the pelvis is largely obscured by streak artifact. Other: No abdominopelvic free air. Reactive free fluid tracking along the attic tip and towards the right paracolic gutter likely tracking from the gallbladder fossa. No bowel containing hernias. Musculoskeletal: Multilevel degenerative changes are present in the imaged portions of the spine. No acute osseous abnormality or suspicious osseous lesion. Total left hip arthroplasty and plate and screw construct of the left ilium results in extensive streak artifact which limits details of the adjacent osseous structures and soft tissues of the pelvis. IMPRESSION: 1. Cholelithiasis with gallbladder wall thickening and pericholecystic fluid tracking throughout the gallbladder fossa and towards the tip of the liver, consistent with acute cholecystitis. 2. No mural calcification of the gallbladder. 3. Mild thickening of the hepatic flexure, likely reactive. 4. Evaluation of the pelvis limited by extensive streak artifact from patient's left hip  arthroplasty and iliac plate screw construct. 5. Aortic Atherosclerosis (ICD10-I70.0). Electronically Signed   By: Lovena Le M.D.   On: 09/15/2018 18:13   Dg Chest Port 1 View  Result Date: 09/15/2018 CLINICAL DATA:  Fever and vomiting today. EXAM: PORTABLE CHEST 1 VIEW COMPARISON:  None. FINDINGS: Fullness of the SUPERIOR mediastinum is noted, question goiter. The remainder of the cardiomediastinal silhouette is unremarkable. There is no evidence of focal airspace disease, pulmonary edema, suspicious pulmonary nodule/mass, pleural effusion, or pneumothorax. No acute bony abnormalities are identified. IMPRESSION: 1. No evidence of acute cardiopulmonary disease. 2. Fullness of the SUPERIOR mediastinum which may represent enlarged thyroid gland. Consider ultrasound for further evaluation. Electronically Signed   By: Margarette Canada M.D.   On: 09/15/2018 17:31   US Abdomen Limited Ruq  Result Date: 09/15/2018 CLINICAL DATA:  85 year old female with elevated LFTs. EXAM: ULTRASOUND ABDOMEN LIMITED RIGHT UPPER QUADRANT COMPARISON:  None. FINDINGS: Gallbladder: Multiple mobile gallstones are identified, the largest measuring 1.1 cm. Gallbladder wall thickening is present. There is no evidence of pericholecystic fluid or sonographic Murphy sign. Apparent gallbladder wall calcifications noted. Common bile duct: Diameter: 3.3 mm. No evidence of intrahepatic or extrahepatic biliary dilatation. Liver: Increased hepatic echogenicity noted. No focal hepatic abnormalities are noted. Portal vein is patent on color Doppler imaging  with normal direction of blood flow towards the liver. Other: None. IMPRESSION: 1. Cholelithiasis with gallbladder wall thickening but without pericholecystic fluid or sonographic Murphy sign - equivocal for acute cholecystitis. 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 2. No evidence of biliary dilatation. 3. Hepatic steatosis  Electronically Signed   By: Margarette Canada M.D.   On: 09/15/2018 15:40     Anti-infectives:   Anti-infectives (From admission, onward)   Start     Dose/Rate Route Frequency Ordered Stop   09/16/18 0400  piperacillin-tazobactam (ZOSYN) IVPB 3.375 g     3.375 g 12.5 mL/hr over 240 Minutes Intravenous Every 8 hours 09/15/18 2027     09/15/18 1545  piperacillin-tazobactam (ZOSYN) IVPB 3.375 g     3.375 g 100 mL/hr over 30 Minutes Intravenous  Once 09/15/18 1542 09/15/18 1954      Alphonsa Overall, MD, FACS Pager: [facility]: [phone] 09/17/2018

## 2018-09-17 NOTE — Progress Notes (Signed)
[facility] Progress Note  ONYINYECHI CLAPSADDLE 85 y.o. 03-29-1927  CC: Abnormal LFTs, abdominal pain, hematemesis   Subjective: Patient is feeling better.  Denies any further vomiting.  Denies abdominal pain, nausea and vomiting.  Last bowel movement yesterday.   Objective: Vital signs in last 24 hours: Vitals:   09/17/18 0240 09/17/18 0607  BP: (!) 145/73 (!) 141/72  Pulse: 88 82  Resp: 16 16  Temp: 98.8 F (37.1 C) 98.9 F (37.2 C)  SpO2: 100% 97%    Physical Exam:  Elderly patient.  Not in acute distress Abdomen is soft, nontender, nondistended and bowel sounds present.  Lab Results: Recent Labs    09/16/18 0154 09/17/18 0355  NA 144 141  K 3.2* 3.8  CL 116* 111  CO2 18* 20*  GLUCOSE 112* 90  BUN 35* 22  CREATININE 1.51* 1.16*  CALCIUM 8.0* 8.4*  MG 1.8  --    Recent Labs    09/16/18 0154 09/17/18 0355  AST 103* 47*  ALT 101* 70*  ALKPHOS 198* 181*  BILITOT 5.0* 2.7*  PROT 5.6* 5.8*  ALBUMIN 2.6* 2.6*   Recent Labs    09/15/18 1254 09/16/18 0154 09/17/18 0355  WBC 18.2* 10.4 10.8*  NEUTROABS 16.2*  --  9.0*  HGB 11.6* 9.5* 9.5*  HCT 35.1* 28.0* 29.7*  MCV 93.9 92.4 95.8  PLT 286 175 236   Recent Labs    09/15/18 1254  LABPROT 14.4  INR 1.1      Assessment/Plan: -Abdominal pain.  Resolved.  CT scan and ultrasound concerning for acute cholecystitis.  LFTs improving. -History of coffee-ground emesis.  Resolved now.  History of gastric ulcer in the past with documented healing of ulcer 2 years ago.  Occult blood positive.  Recommendations ----------------------- -MRI MRCP showed no evidence of biliary ductal dilation or choledocholithiasis.  It showed cholelithiasis with focal gallbladder wall thickening.  --Discussed with patient's son and daughter.  Given history of ulcer disease and episode of recent hematemesis, plan for EGD tomorrow.  -Okay to have clear liquid diet today.  N.p.o. past midnight.  Continue PPI for  now.  Risks (bleeding, infection, bowel perforation that could require surgery, sedation-related changes in cardiopulmonary systems), benefits (identification and possible treatment of source of symptoms, exclusion of certain causes of symptoms), and alternatives (watchful waiting, radiographic imaging studies, empiric medical treatment)  were explained to family in detail and patient wishes to proceed.     Otis Brace MD, [place] 09/17/2018, 12:05 PM  Contact #  [phone]

## 2018-09-17 NOTE — Progress Notes (Signed)
Pt called out c/o dizziness, " like the room is spinning" when moves her head or looks down. VSS. Speech clear. PEARL. Moving extremities. Md notified. Eulas Post, RN

## 2018-09-17 NOTE — Progress Notes (Addendum)
[facility] Progress Note  Patient: [name] [id number]   PCP: Earney Mallet, MD DOB: 05-Feb-1927   DOA: 09/15/2018   DOS: 09/17/2018   Date of Service: the patient was seen and examined on 09/17/2018  Brief hospital course: Pt. with PMH of HTN, CKD, prior GI bleed; admitted on 09/15/2018, presented with complaint of coffee colored emesis and fever, was found to have acute cholecystitis as well as possible upper GI bleed and acute kidney injury. Currently further plan is continue IV antibiotics and further work-up depending on GI and general surgery recommendation.  Subjective: Currently reports no abdominal pain.  No nausea.  No further vomiting.  Somewhat confused today, trying to get out of bed despite multiple attempts to redirect.   Assessment and Plan: 1. Acute upper GI bleed GI consulted. Continue IV Protonix. H&H relatively stable for now. GI recommends EGD 09/18/2018. Currently on IV Protonix.  2.  Acute cholecystitis. General surgery consulted. Elevated LFTs. CT scan shows no evidence of elevated dilated CBD. Currently on IV Zosyn. GI and general surgery recommends MRCP. Which was done on 08/29 and negative for any CBDstone  3.  Acute kidney injury. In the setting of dehydration from vomiting as well as active infection. Continue aggressive IV hydration. Avoid nephrotoxic medications. Monitor renal function. Renal function getting better. CRCL 33.   4.  Chronic neuropathy pain. Patient is on Lyrica. We will hold .  5.  Hypotension. Multifactorial. In the setting of infection as well as hemorrhage. Monitor.  Improving.  6.  Possible aspiration pneumonia. In the setting of vomiting. Patient is already on Zosyn. We will continue for now. No growth in blood cultures. No hypoxia.  7.  New onset A. fib. Rate controlled. Patient fluctuates between in and out of A. fib and normal sinus rhythm. Currently will monitor. Echocardiogram tomorrow.  Contraindication to anticoagulation given her GI bleed.  Addendum: The RN called with the patient reported some acute dizziness. Patient was evaluated at the bedside. At the time of my arrival patient was holding there bed railing. Patient was not making eye contact with the concern that she will follow down. Patient denied any headache, blurred vision, tingling or numbness, nausea, vomiting, active pain. No other acute events identified by the RN. When I asked the patient where she was she said that she was at [facility] standing on the concrete floor.  Mental status alert to person, knows herself as well as her daughter, knows her daughter's birthday. Thinks that she is in [place]. Thinks that she is actually standing on a concrete floor while she is in the bed,  speech normal, attention normal,  Somewhat anxious. Cranial Nerves PERRL, EOM normal and present, Motor strength bilateral equal strength 5/5,  Sensation present to light touch,  reflexes difficult to elicit due to patient cooperation. Proprioception and cerebellar test difficult to perform secondary to patient's cooperation.  Assessment and plan. Acute onset peripheral vertigo. Sundowning and delirium Do not think that this is actually acute stroke. Patient symptomatic although no nystagmus identified on my examination. Concern that this is sundowning and delirium in a patient with chronic dementia. Ordered IV lorazepam and IV Reglan. Stat CT head. Further work-up depending on the results of the CT scan of the head. Would prefer meclizine. Continue with Seroquel nightly.  Berle Mull 5:37 PM 09/17/2018    CT scan unremarkable. Vertigo resolved. Do not think that this is stroke. Continue to monitor. Neuro checks every 4 hours.  7:07 PM   Diet:  clear liquid diet NPO after midnight DVT Prophylaxis: SCD, pharmacological prophylaxis contraindicated due to Acute GI bleed  Advance goals of care discussion: Full code   Family Communication: no family was present at bedside, at the time of interview. The pt provided permission to discuss medical plan with the family. Opportunity was given to ask question and all questions were answered satisfactorily.   Disposition:  Discharge to Home  Consultants: gastroenterology  General surgery  Procedures: none  Scheduled Meds: . Chlorhexidine Gluconate Cloth  6 each Topical Daily  . mirabegron ER  50 mg Oral Daily  . pantoprazole (PROTONIX) IV  40 mg Intravenous Q12H  . QUEtiapine  12.5 mg Oral QHS  . sodium chloride flush  3 mL Intravenous Q12H   Continuous Infusions: . lactated ringers 100 mL/hr at 09/17/18 1158  . piperacillin-tazobactam (ZOSYN)  IV 3.375 g (09/17/18 1427)   PRN Meds: acetaminophen **OR** acetaminophen, ondansetron **OR** ondansetron (ZOFRAN) IV Antibiotics: Anti-infectives (From admission, onward)   Start     Dose/Rate Route Frequency Ordered Stop   09/16/18 0400  piperacillin-tazobactam (ZOSYN) IVPB 3.375 g     3.375 g 12.5 mL/hr over 240 Minutes Intravenous Every 8 hours 09/15/18 2027     09/15/18 1545  piperacillin-tazobactam (ZOSYN) IVPB 3.375 g     3.375 g 100 mL/hr over 30 Minutes Intravenous  Once 09/15/18 1542 09/15/18 1954       Objective: Physical Exam: Vitals:   09/17/18 0240 09/17/18 0548 09/17/18 0607 09/17/18 1242  BP: (!) 145/73  (!) 141/72 134/86  Pulse: 88  82 80  Resp: 16  16 16   Temp: 98.8 F (37.1 C)  98.9 F (37.2 C) 98.6 F (37 C)  TempSrc: Oral  Oral Oral  SpO2: 100%  97% 96%  Weight:  71.5 kg    Height:        Intake/Output Summary (Last 24 hours) at 09/17/2018 1655 Last data filed at 09/17/2018 1358 Gross per 24 hour  Intake 1457.85 ml  Output 2300 ml  Net -842.15 ml   Filed Weights   09/15/18 2200 09/16/18 0500 09/17/18 0548  Weight: 71.2 kg 71 kg 71.5 kg   General: alert and oriented to time, place, and person. Appear in mild distress, affect appropriate Eyes: PERRL, Conjunctiva  normal ENT: Oral Mucosa Clear, moist  Neck: no JVD, no Abnormal Mass Or lumps Cardiovascular: S1 and S2 Present, no Murmur, peripheral pulses symmetrical Respiratory: normal respiratory effort, Bilateral Air entry equal and Decreased, no use of accessory muscle, Clear to Auscultation, no Crackles, no wheezes Abdomen: Bowel Sound present, Soft and mild tenderness, no hernia Skin: no rashes  Extremities: no Pedal edema, no calf tenderness Neurologic: normal without focal findings, mental status, speech normal, alert and oriented x3, PERLA, Motor strength 5/5 and symmetric and sensation grossly normal to light touch Gait not checked due to patient safety concerns  Data Reviewed: CBC: Recent Labs  Lab 09/15/18 1254 09/16/18 0154 09/17/18 0355  WBC 18.2* 10.4 10.8*  NEUTROABS 16.2*  --  9.0*  HGB 11.6* 9.5* 9.5*  HCT 35.1* 28.0* 29.7*  MCV 93.9 92.4 95.8  PLT 286 175 [id number]   Basic Metabolic Panel: Recent Labs  Lab 09/15/18 1254 09/16/18 0154 09/17/18 0355  NA 140 144 141  K 3.4* 3.2* 3.8  CL 108 116* 111  CO2 22 18* 20*  GLUCOSE 155* 112* 90  BUN 35* 35* 22  CREATININE 2.06* 1.51* 1.16*  CALCIUM 8.5* 8.0* 8.4*  MG  --  1.8  --  Liver Function Tests: Recent Labs  Lab 09/15/18 1254 09/16/18 0154 09/17/18 0355  AST 177* 103* 47*  ALT 130* 101* 70*  ALKPHOS 271* 198* 181*  BILITOT 5.7* 5.0* 2.7*  PROT 6.7 5.6* 5.8*  ALBUMIN 3.0* 2.6* 2.6*   Recent Labs  Lab 09/15/18 1254  LIPASE 27   No results for input(s): AMMONIA in the last 168 hours. Coagulation Profile: Recent Labs  Lab 09/15/18 1254  INR 1.1   Cardiac Enzymes: No results for input(s): CKTOTAL, CKMB, CKMBINDEX, TROPONINI in the last 168 hours. BNP (last 3 results) No results for input(s): PROBNP in the last 8760 hours. CBG: Recent Labs  Lab 09/16/18 0748  GLUCAP 99   Studies: Mr Abdomen Mrcp Wo Contrast  Result Date: 09/17/2018 CLINICAL DATA:  Cholelithiasis.  Abnormal liver function  tests. EXAM: MRI ABDOMEN WITHOUT CONTRAST  (INCLUDING MRCP) TECHNIQUE: Multiplanar multisequence MR imaging of the abdomen was performed. Heavily T2-weighted images of the biliary and pancreatic ducts were obtained, and three-dimensional MRCP images were rendered by post processing. COMPARISON:  09/15/2018 CT and ultrasound FINDINGS: Exam is severely degraded, nearly nondiagnostic. Limitations include motion artifact. Patient was unable to follow directions and requested to be taken out of the scanner. Lower chest: Mild cardiomegaly. Bibasilar airspace disease which is suboptimally evaluated but may be progressive on the left. Example image 3/7. Hepatobiliary: Hepatomegaly at 19 cm craniocaudal. No dominant liver lesion. No intrahepatic biliary duct dilatation. The gallbladder is mildly distended. Stones within including at up to 9 mm. The fundal wall is thickened at 6 mm on 23/7. No convincing evidence of pericholecystic edema. The common duct is normal in caliber, best evaluated on 21/2. No choledocholithiasis. Pancreas:  Normal, without mass or ductal dilatation. Spleen:  Normal in size, without focal abnormality. Adrenals/Urinary Tract: Normal adrenal glands. Normal kidneys for age, with expected cortical thinning but no hydronephrosis. Stomach/Bowel: Periampullary duodenal diverticulum. Otherwise grossly normal stomach and abdominal bowel loops. Vascular/Lymphatic: Normal caliber of the aorta and branch vessels. Prominent porta hepatis nodes including at 8 mm on 14/7. Likely reactive. Other: No ascites. Diffuse anasarca. Favor artifactual T2 hypointensity within the bladder on 20/2. No stone on recent CT. Musculoskeletal: Lumbosacral spondylosis with mild S-shaped spine curvature. IMPRESSION: 1. Severely degraded exam, as detailed above. 2. Cholelithiasis with focal gallbladder wall thickening and mild gallbladder distension. Findings remain equivocal for acute cholecystitis. 3. No biliary duct dilatation or  choledocholithiasis. 4. Hepatomegaly. 5. Bibasilar airspace disease, which may be progressive, especially on the left. Correlate with symptoms to suggest ongoing infection or aspiration. Electronically Signed   By: Abigail Miyamoto M.D.   On: 09/17/2018 11:49     Time spent: 35 minutes  Author: Berle Mull, MD [facility] 09/17/2018 4:55 PM  To reach On-call, see care teams to locate the attending and reach out to them via [email or web address]. If 7PM-7AM, please contact night-coverage If you still have difficulty reaching the attending provider, please page the [facility] (Director on Call) for [facility] on amion for assistance.

## 2018-09-17 NOTE — Progress Notes (Addendum)
Patient calmer though still confused. States she does "not feel dizzy now" and able to move her head, eyes and move around in bed. Will continue to monitor. Daughter at bedside. Eulas Post, RN

## 2018-09-17 NOTE — Progress Notes (Signed)
Cardiac Monitoring Event  Dysrhythmia: Atrial fibrillation  Symptoms: Irregular heart rate, no murmur, peripheral pulses symmetrical  Level of Consciousness:  Alert   Last set of vital signs taken:  Temp: 99.5 F (37.5 C)  Pulse Rate: (!) 101  Resp: 16  BP: 130/63  SpO2: 94 %  Name of MD Notified:  X. Blount, NP  Time MD Notified: 0110  Comments/Actions Taken:  Vital signs and  EKG

## 2018-09-18 ENCOUNTER — Inpatient Hospital Stay (HOSPITAL_COMMUNITY): Payer: Medicare Other | Admitting: Certified Registered"

## 2018-09-18 ENCOUNTER — Encounter (HOSPITAL_COMMUNITY)
Admission: EM | Disposition: A | Discharge status: Home / Self Care | Payer: Self-pay | Source: Home / Self Care | Attending: Internal Medicine

## 2018-09-18 ENCOUNTER — Encounter (HOSPITAL_COMMUNITY): Payer: Self-pay | Admitting: Anesthesiology

## 2018-09-18 HISTORY — PX: BIOPSY: SHX5522

## 2018-09-18 HISTORY — PX: ESOPHAGOGASTRODUODENOSCOPY (EGD) WITH PROPOFOL: SHX5813

## 2018-09-18 LAB — COMPREHENSIVE METABOLIC PANEL
ALT: 52 U/L — ABNORMAL HIGH (ref 0–44)
AST: 28 U/L (ref 15–41)
Albumin: 2.8 g/dL — ABNORMAL LOW (ref 3.5–5.0)
Alkaline Phosphatase: 159 U/L — ABNORMAL HIGH (ref 38–126)
Anion gap: 11 (ref 5–15)
BUN: 18 mg/dL (ref 8–23)
CO2: 23 mmol/L (ref 22–32)
Calcium: 8.4 mg/dL — ABNORMAL LOW (ref 8.9–10.3)
Chloride: 106 mmol/L (ref 98–111)
Creatinine, Ser: 1.2 mg/dL — ABNORMAL HIGH (ref 0.44–1.00)
GFR calc Af Amer: 47 mL/min — ABNORMAL LOW (ref >60)
GFR calc non Af Amer: 40 mL/min — ABNORMAL LOW (ref >60)
Glucose, Bld: 88 mg/dL (ref 70–99)
Potassium: 3.2 mmol/L — ABNORMAL LOW (ref 3.5–5.1)
Sodium: 140 mmol/L (ref 135–145)
Total Bilirubin: 2.4 mg/dL — ABNORMAL HIGH (ref 0.3–1.2)
Total Protein: 6.2 g/dL — ABNORMAL LOW (ref 6.5–8.1)

## 2018-09-18 LAB — CBC WITH DIFFERENTIAL/PLATELET
Abs Immature Granulocytes: 0.15 10*3/uL — ABNORMAL HIGH (ref 0.00–0.07)
Basophils Absolute: 0.1 10*3/uL (ref 0.0–0.1)
Basophils Relative: 1 %
Eosinophils Absolute: 0.2 10*3/uL (ref 0.0–0.5)
Eosinophils Relative: 2 %
HCT: 31.8 % — ABNORMAL LOW (ref 36.0–46.0)
Hemoglobin: 10.1 g/dL — ABNORMAL LOW (ref 12.0–15.0)
Immature Granulocytes: 2 %
Lymphocytes Relative: 17 %
Lymphs Abs: 1.5 10*3/uL (ref 0.7–4.0)
MCH: 30.6 pg (ref 26.0–34.0)
MCHC: 31.8 g/dL (ref 30.0–36.0)
MCV: 96.4 fL (ref 80.0–100.0)
Monocytes Absolute: 0.5 10*3/uL (ref 0.1–1.0)
Monocytes Relative: 6 %
Neutro Abs: 6.6 10*3/uL (ref 1.7–7.7)
Neutrophils Relative %: 72 %
Platelets: 279 10*3/uL (ref 150–400)
RBC: 3.3 MIL/uL — ABNORMAL LOW (ref 3.87–5.11)
RDW: 13.6 % (ref 11.5–15.5)
WBC: 9.1 10*3/uL (ref 4.0–10.5)
nRBC: 0 % (ref 0.0–0.2)

## 2018-09-18 SURGERY — ESOPHAGOGASTRODUODENOSCOPY (EGD) WITH PROPOFOL
Anesthesia: Monitor Anesthesia Care

## 2018-09-18 MED ORDER — PROPOFOL 10 MG/ML IV BOLUS
INTRAVENOUS | Status: AC
  Filled 2018-09-18: qty 60

## 2018-09-18 MED ORDER — PANTOPRAZOLE SODIUM 40 MG PO TBEC
40.0000 mg | DELAYED_RELEASE_TABLET | Freq: Every day | ORAL | Status: DC
  Administered 2018-09-18 – 2018-09-21 (×4): 40 mg via ORAL
  Filled 2018-09-18 (×4): qty 1

## 2018-09-18 MED ORDER — PROPOFOL 10 MG/ML IV BOLUS
INTRAVENOUS | Status: DC | PRN
  Administered 2018-09-18: 30 mg via INTRAVENOUS

## 2018-09-18 MED ORDER — PROPOFOL 500 MG/50ML IV EMUL
INTRAVENOUS | Status: DC | PRN
  Administered 2018-09-18: 100 ug/kg/min via INTRAVENOUS

## 2018-09-18 SURGICAL SUPPLY — 14 items

## 2018-09-18 NOTE — Progress Notes (Signed)
[facility] Progress Note  Patient: [name] [id number]   PCP: Earney Mallet, MD DOB: Apr 16, 1927   DOA: 09/15/2018   DOS: 09/18/2018   Date of Service: the patient was seen and examined on 09/18/2018  Brief hospital course: Pt. with PMH of HTN, CKD, prior GI bleed; admitted on 09/15/2018, presented with complaint of coffee colored emesis and fever, was found to have acute cholecystitis as well as possible upper GI bleed and acute kidney injury. Currently further plan is continue IV antibiotics and further work-up depending on GI and general surgery recommendation.  Subjective: no further dizziness no acute complains.   Assessment and Plan: 1. Acute upper GI bleed GI consulted. Continue IV Protonix. H&H relatively stable for now. S/p EGD 09/18/2018 no active bleeding, ulcer. Gastritis. GI signed off. Currently on Protonix.  2.  Acute cholecystitis. General surgery consulted. Elevated LFTs. CT scan shows no evidence of elevated dilated CBD. Currently on IV Zosyn. GI and general surgery recommends MRCP. Which was done on 08/29 and negative for any CBD stone  3.  Acute kidney injury. In the setting of dehydration from vomiting as well as active infection. Continue aggressive IV hydration. Avoid nephrotoxic medications. Monitor renal function. Renal function getting better. CRCL 33.   4.  Chronic neuropathy pain. Patient is on Lyrica. We will hold .  5.  Hypotension. Multifactorial. In the setting of infection as well as hemorrhage. Monitor.  Improving.  6.  Possible aspiration pneumonia. In the setting of vomiting. Patient is already on Zosyn. We will continue for now. No growth in blood cultures. No hypoxia.  7.  New onset A. fib. Rate controlled. Patient fluctuates between in and out of A. fib and normal sinus rhythm. Currently will monitor. Echocardiogram tomorrow. Contraindication to anticoagulation given her GI bleed.  8 Acute onset  peripheral vertigo. Sundowning and delirium Do not think that this is actually acute stroke. Patient symptomatic although no nystagmus identified on my examination. Concern that this is sundowning and delirium in a patient with chronic dementia. Stat CT head negative, no further neuro deficit.  Continue meclizine. Continue with Seroquel nightly.  Diet: soft diet DVT Prophylaxis: SCD, pharmacological prophylaxis contraindicated due to Acute GI bleed  Advance goals of care discussion: Full code  Family Communication: no family was present at bedside, at the time of interview. The pt provided permission to discuss medical plan with the family. Opportunity was given to ask question and all questions were answered satisfactorily.   Disposition:  Discharge to Home  Consultants: gastroenterology  General surgery  Procedures: EGD   Scheduled Meds: . Chlorhexidine Gluconate Cloth  6 each Topical Daily  . meclizine  25 mg Oral TID  . mirabegron ER  50 mg Oral Daily  . pantoprazole  40 mg Oral Daily  . QUEtiapine  12.5 mg Oral QHS  . sodium chloride flush  3 mL Intravenous Q12H   Continuous Infusions: . lactated ringers 0 mL/hr at 09/18/18 0351  . piperacillin-tazobactam (ZOSYN)  IV 12.5 mL/hr at 09/18/18 0600   PRN Meds: acetaminophen **OR** acetaminophen, ondansetron **OR** ondansetron (ZOFRAN) IV Antibiotics: Anti-infectives (From admission, onward)   Start     Dose/Rate Route Frequency Ordered Stop   09/16/18 0400  piperacillin-tazobactam (ZOSYN) IVPB 3.375 g     3.375 g 12.5 mL/hr over 240 Minutes Intravenous Every 8 hours 09/15/18 2027     09/15/18 1545  piperacillin-tazobactam (ZOSYN) IVPB 3.375 g     3.375 g 100 mL/hr over 30 Minutes Intravenous  Once 09/15/18 1542 09/15/18 1954       Objective: Physical Exam: Vitals:   09/18/18 1030 09/18/18 1040 09/18/18 1050 09/18/18 1121  BP: (!) 130/44 (!) 140/41 (!) 154/44 (!) 157/79  Pulse: 83 79 73 87  Resp: 14 13 12 16    Temp: (!) 97.1 F (36.2 C)   97.8 F (36.6 C)  TempSrc: Temporal   Oral  SpO2: 97% 95% 96% 99%  Weight:      Height:        Intake/Output Summary (Last 24 hours) at 09/18/2018 1441 Last data filed at 09/18/2018 1036 Gross per 24 hour  Intake 1507.61 ml  Output 1000 ml  Net 507.61 ml   Filed Weights   09/16/18 0500 09/17/18 0548 09/18/18 0624  Weight: 71 kg 71.5 kg 68.2 kg   General: alert and oriented to time, place, and person. Appear in mild distress, affect appropriate Eyes: PERRL, Conjunctiva normal ENT: Oral Mucosa Clear, moist  Neck: no JVD, no Abnormal Mass Or lumps Cardiovascular: S1 and S2 Present, no Murmur, peripheral pulses symmetrical Respiratory: normal respiratory effort, Bilateral Air entry equal and Decreased, no use of accessory muscle, Clear to Auscultation, no Crackles, no wheezes Abdomen: Bowel Sound present, Soft and mild tenderness, no hernia Skin: no rashes  Extremities: no Pedal edema, no calf tenderness Neurologic: normal without focal findings, mental status, speech normal, alert and oriented x3, PERLA, Motor strength 5/5 and symmetric and sensation grossly normal to light touch Gait not checked due to patient safety concerns  Data Reviewed: CBC: Recent Labs  Lab 09/15/18 1254 09/16/18 0154 09/17/18 0355 09/18/18 0449  WBC 18.2* 10.4 10.8* 9.1  NEUTROABS 16.2*  --  9.0* 6.6  HGB 11.6* 9.5* 9.5* 10.1*  HCT 35.1* 28.0* 29.7* 31.8*  MCV 93.9 92.4 95.8 96.4  PLT 286 175 236 [id number]   Basic Metabolic Panel: Recent Labs  Lab 09/15/18 1254 09/16/18 0154 09/17/18 0355 09/18/18 0449  NA 140 144 141 140  K 3.4* 3.2* 3.8 3.2*  CL 108 116* 111 106  CO2 22 18* 20* 23  GLUCOSE 155* 112* 90 88  BUN 35* 35* 22 18  CREATININE 2.06* 1.51* 1.16* 1.20*  CALCIUM 8.5* 8.0* 8.4* 8.4*  MG  --  1.8  --   --     Liver Function Tests: Recent Labs  Lab 09/15/18 1254 09/16/18 0154 09/17/18 0355 09/18/18 0449  AST 177* 103* 47* 28  ALT 130* 101* 70*  52*  ALKPHOS 271* 198* 181* 159*  BILITOT 5.7* 5.0* 2.7* 2.4*  PROT 6.7 5.6* 5.8* 6.2*  ALBUMIN 3.0* 2.6* 2.6* 2.8*   Recent Labs  Lab 09/15/18 1254  LIPASE 27   No results for input(s): AMMONIA in the last 168 hours. Coagulation Profile: Recent Labs  Lab 09/15/18 1254  INR 1.1   Cardiac Enzymes: No results for input(s): CKTOTAL, CKMB, CKMBINDEX, TROPONINI in the last 168 hours. BNP (last 3 results) No results for input(s): PROBNP in the last 8760 hours. CBG: Recent Labs  Lab 09/16/18 0748  GLUCAP 99   Studies: Ct Head Wo Contrast  Result Date: 09/17/2018 CLINICAL DATA:  85 year old female with ataxia, ?The room is spinning?Marland Kitchen EXAM: CT HEAD WITHOUT CONTRAST TECHNIQUE: Contiguous axial images were obtained from the base of the skull through the vertex without intravenous contrast. COMPARISON:  None. FINDINGS: Brain: Cerebral volume is within normal limits for age. No midline shift, mass effect, or evidence of intracranial mass lesion. No ventriculomegaly. Mild dural calcifications, including at the right operculum.  No acute intracranial hemorrhage identified. Patchy cerebral white matter hypodensity mostly in the anterior frontal lobes. No cortical encephalomalacia. No cortically based acute infarct identified. Vascular: Calcified atherosclerosis at the skull base. Right MCA calcified plaque. No suspicious intracranial vascular hyperdensity. Skull: Negative. Partially visible upper cervical spine degeneration. Sinuses/Orbits: Scattered mild paranasal sinus mucosal thickening, well pneumatized sinuses overall. Bilateral tympanic cavities and mastoids appear clear. Other: No acute orbit or scalp soft tissue findings. IMPRESSION: 1. No acute intracranial abnormality. 2. Mild to moderate for age nonspecific cerebral white matter changes, most commonly due to chronic small vessel disease. 3. Mild paranasal sinus disease, significance doubtful. Electronically Signed   By: Genevie Ann M.D.   On:  09/17/2018 18:28     Time spent: 35 minutes  Author: Berle Mull, MD [facility] 09/18/2018 2:41 PM  To reach On-call, see care teams to locate the attending and reach out to them via [email or web address]. If 7PM-7AM, please contact night-coverage If you still have difficulty reaching the attending provider, please page the [facility] (Director on Call) for [facility] on amion for assistance.

## 2018-09-18 NOTE — Progress Notes (Signed)
After IVT placed a new functioning IV site, patient continued to pull at the site.  IV nurse wrapped site in kerlix, but the site was still in danger.  A mitten was placed on the other hand, to prevent losing this IV site.

## 2018-09-18 NOTE — Anesthesia Preprocedure Evaluation (Addendum)
Anesthesia Evaluation  Patient identified by MRN, date of birth, ID band Patient confused    Reviewed: Allergy & Precautions, NPO status , Patient's Chart, lab work & pertinent test results  Airway Mallampati: II  TM Distance: <3 FB Neck ROM: Full    Dental  (+) Edentulous Upper, Edentulous Lower   Pulmonary neg pulmonary ROS,    breath sounds clear to auscultation       Cardiovascular hypertension, Pt. on medications  Rhythm:Regular Rate:Normal     Neuro/Psych negative neurological ROS  negative psych ROS   GI/Hepatic Neg liver ROS, GERD  Medicated,  Endo/Other  negative endocrine ROS  Renal/GU Renal InsufficiencyRenal disease     Musculoskeletal  (+) Arthritis ,   Abdominal Normal abdominal exam  (+)   Peds  Hematology  (+) anemia ,   Anesthesia Other Findings   Reproductive/Obstetrics negative OB ROS                            Anesthesia Physical Anesthesia Plan  ASA: III  Anesthesia Plan: MAC   Post-op Pain Management:    Induction: Intravenous  PONV Risk Score and Plan: Propofol infusion and Ondansetron  Airway Management Planned: Natural Airway and Nasal Cannula  Additional Equipment: None  Intra-op Plan:   Post-operative Plan:   Informed Consent: I have reviewed the patients History and Physical, chart, labs and discussed the procedure including the risks, benefits and alternatives for the proposed anesthesia with the patient or authorized representative who has indicated his/her understanding and acceptance.     Consent reviewed with POA  Plan Discussed with: CRNA  Anesthesia Plan Comments: (COVID-19 Labs  No results for input(s): DDIMER, FERRITIN, LDH, CRP in the last 72 hours.  Lab Results      Component                Value               Date                      [id number]              NEGATIVE            09/15/2018            )       Anesthesia  Quick Evaluation

## 2018-09-18 NOTE — Progress Notes (Signed)
[facility] Progress Note  DARINDA GARTON 85 y.o. [birth year]  CC: Abnormal LFTs, abdominal pain, hematemesis   Subjective: Patient is feeling better.  Denies any further vomiting.  Denies abdominal pain, nausea and vomiting.  No further bleeding episodes   Objective: Vital signs in last 24 hours: Vitals:   09/18/18 0624 09/18/18 0952  BP: 140/65 (!) 148/57  Pulse: 91 89  Resp: 16 14  Temp: 97.9 F (36.6 C) 97.7 F (36.5 C)  SpO2: 98% 98%    Physical Exam:  Elderly patient.  Not in acute distress Abdomen is soft, nontender, nondistended and bowel sounds present.  Lab Results: Recent Labs    09/16/18 0154 09/17/18 0355 09/18/18 0449  NA 144 141 140  K 3.2* 3.8 3.2*  CL 116* 111 106  CO2 18* 20* 23  GLUCOSE 112* 90 88  BUN 35* 22 18  CREATININE 1.51* 1.16* 1.20*  CALCIUM 8.0* 8.4* 8.4*  MG 1.8  --   --    Recent Labs    09/17/18 0355 09/18/18 0449  AST 47* 28  ALT 70* 52*  ALKPHOS 181* 159*  BILITOT 2.7* 2.4*  PROT 5.8* 6.2*  ALBUMIN 2.6* 2.8*   Recent Labs    09/17/18 0355 09/18/18 0449  WBC 10.8* 9.1  NEUTROABS 9.0* 6.6  HGB 9.5* 10.1*  HCT 29.7* 31.8*  MCV 95.8 96.4  PLT 236 279   Recent Labs    09/15/18 1254  LABPROT 14.4  INR 1.1      Assessment/Plan: -Abdominal pain.  Resolved.  CT scan and ultrasound concerning for acute cholecystitis.  LFTs improving.MRI MRCP showed no evidence of biliary ductal dilation or choledocholithiasis.  It showed cholelithiasis with focal gallbladder wall thickening.  -History of coffee-ground emesis.  Resolved now.  History of gastric ulcer in the past with documented healing of ulcer 2 years ago.  Occult blood positive. -Abnormal LFTs.  Stable/improving  Recommendations ----------------------- -EGD today.   Risks (bleeding, infection, bowel perforation that could require surgery, sedation-related changes in cardiopulmonary systems), benefits (identification and possible treatment of  source of symptoms, exclusion of certain causes of symptoms), and alternatives (watchful waiting, radiographic imaging studies, empiric medical treatment)  were explained with patient today and with family yesterday  in detail and patient wishes to proceed.     Otis Brace MD, [place] 09/18/2018, 9:54 AM  Contact #  [phone]

## 2018-09-18 NOTE — Anesthesia Postprocedure Evaluation (Signed)
Anesthesia Post Note  Patient: [name]  Procedure(s) Performed: ESOPHAGOGASTRODUODENOSCOPY (EGD) WITH PROPOFOL (N/A ) BIOPSY     Patient location during evaluation: PACU Anesthesia Type: MAC Level of consciousness: awake and alert Pain management: pain level controlled Vital Signs Assessment: post-procedure vital signs reviewed and stable Respiratory status: spontaneous breathing, nonlabored ventilation, respiratory function stable and patient connected to nasal cannula oxygen Cardiovascular status: stable and blood pressure returned to baseline Postop Assessment: no apparent nausea or vomiting Anesthetic complications: no    Last Vitals:  Vitals:   09/18/18 1040 09/18/18 1050  BP: (!) 140/41 (!) 154/44  Pulse: 79 73  Resp: 13 12  Temp:    SpO2: 95% 96%    Last Pain:  Vitals:   09/18/18 1030  TempSrc: Temporal  PainSc:                  [name]

## 2018-09-18 NOTE — Progress Notes (Signed)
Pharmacy Antibiotic Note  [name] is a 85 y.o. female admitted on 09/15/2018 with intra-abdominal infection.  Pharmacy has been consulted for Zosyn dosing. AKI resolving, LFTs improving, blood cultures NGTD x48h.  Plan: Zosyn 3.375g IV q8h (4 hour infusion).  Monitor renal function and cx data   Height: 5\' 5"  (165.1 cm) Weight: 150 lb 6.4 oz (68.2 kg) IBW/kg (Calculated) : 57  Temp (24hrs), Avg:98.5 F (36.9 C), Min:97.9 F (36.6 C), Max:99 F (37.2 C)  Recent Labs  Lab 09/15/18 1254 09/15/18 1721 09/16/18 0154 09/17/18 0355 09/18/18 0449  WBC 18.2*  --  10.4 10.8* 9.1  CREATININE 2.06*  --  1.51* 1.16* 1.20*  LATICACIDVEN  --  1.1  --   --   --     Estimated Creatinine Clearance: 29.2 mL/min (A) (by C-G formula based on SCr of 1.2 mg/dL (H)).    Allergies  Allergen Reactions  . Aspirin Other (See Comments)    Reaction unknown  . Nsaids Other (See Comments)    Bleeding, skin hemorrhages  . Silver Rash    Antimicrobials this admission: 8/27 Zosyn >>   Dose adjustments this admission:  Microbiology results: 8/27 BCx: NGTD 8/27 COVID: negative  Thank you for allowing pharmacy to be a part of this patient's care.  Arrie Senate, PharmD, BCPS Clinical Pharmacist Please check AMION for all [place] numbers 09/18/2018

## 2018-09-18 NOTE — Progress Notes (Signed)
[facility]:  [phone] General Surgery Progress Note   LOS: 3 days  POD -     Chief Complaint: Abdominal pain  Assessment and Plan: 1.  Cholecystitis, cholelithiasis  Zosyn  Clarify GI bleed with upper endoscopy - then either antibiotic treatment of gall bladder disease or gall bladder surgery during this admission.   2.  Elevated bilirubin  Down to 2.4 - 09/18/2018   Mild elevation of LFT's  MRCP - 09/17/2018 - neg for CBD stones 3.  GI bleed  Hgb - 10.1 - 09/18/2018  On IV protonix  For upper endo today - Dr. Alessandra Bevels 4.  CKD  Creatinine - 1.20 - 09/18/2018 5.  Chronic neuropathy pain 6.  DVT prophylaxis - on hold for potential GI bleed 7.  Early dementia  More confused last night and this AM 8.  History of left hip surgery with left leg injury in 2009  Wears a brace for her left leg and uses a walker   Principal Problem:   Acute upper GI bleed Active Problems:   AKI (acute kidney injury) ([place])   Benign essential HTN   Acute cholecystitis   Transaminitis  Subjective:  More confused today.  Does not want to answer questions.  And confused last PM.  Daughter - Lynniah Janoski.  I met Caren Griffins at the bedside on 8/29 and reviewed the finding. Ms. Woodbeck also has a son.   Her husband died in 01/29/2018 from pneumonia after a long illness.  Ms. Adamek lives in [place], [place].  Her PCP is Dr. Earney Mallet.  She is to see a neurologist, Dr. Volanda Napoleon, in [place], about dementia.  Objective:   Vitals:   09/17/18 2146 09/18/18 0624  BP: 137/88 140/65  Pulse: 90 91  Resp: 18 16  Temp: 99 F (37.2 C) 97.9 F (36.6 C)  SpO2: 98% 98%     Intake/Output from previous day:  08/29 0701 - 08/30 0700 In: 1201.9 [P.O.:60; I.V.:1016.8; IV Piggyback:125] Out: 850 [Urine:850]  Intake/Output this shift:  No intake/output data recorded.   Physical Exam:   General: Older WF who is alert.  She has a tremor    HEENT: Normal. Pupils equal. .   Lungs:  Clear.   Abdomen: Soft.  No focal tenderness.   Lab Results:    Recent Labs    09/17/18 0355 09/18/18 0449  WBC 10.8* 9.1  HGB 9.5* 10.1*  HCT 29.7* 31.8*  PLT 236 279    BMET   Recent Labs    09/17/18 0355 09/18/18 0449  NA 141 140  K 3.8 3.2*  CL 111 106  CO2 20* 23  GLUCOSE 90 88  BUN 22 18  CREATININE 1.16* 1.20*  CALCIUM 8.4* 8.4*    PT/INR   Recent Labs    09/15/18 1254  LABPROT 14.4  INR 1.1    ABG  No results for input(s): PHART, HCO3 in the last 72 hours.  Invalid input(s): PCO2, PO2   Studies/Results:  Ct Head Wo Contrast  Result Date: 09/17/2018 CLINICAL DATA:  85 year old female with ataxia, ?The room is spinning?Marland Kitchen EXAM: CT HEAD WITHOUT CONTRAST TECHNIQUE: Contiguous axial images were obtained from the base of the skull through the vertex without intravenous contrast. COMPARISON:  None. FINDINGS: Brain: Cerebral volume is within normal limits for age. No midline shift, mass effect, or evidence of intracranial mass lesion. No ventriculomegaly. Mild dural calcifications, including at the right operculum. No acute intracranial hemorrhage identified. Patchy cerebral white matter hypodensity  mostly in the anterior frontal lobes. No cortical encephalomalacia. No cortically based acute infarct identified. Vascular: Calcified atherosclerosis at the skull base. Right MCA calcified plaque. No suspicious intracranial vascular hyperdensity. Skull: Negative. Partially visible upper cervical spine degeneration. Sinuses/Orbits: Scattered mild paranasal sinus mucosal thickening, well pneumatized sinuses overall. Bilateral tympanic cavities and mastoids appear clear. Other: No acute orbit or scalp soft tissue findings. IMPRESSION: 1. No acute intracranial abnormality. 2. Mild to moderate for age nonspecific cerebral white matter changes, most commonly due to chronic small vessel disease. 3. Mild paranasal sinus disease, significance doubtful. Electronically Signed   By: Genevie Ann M.D.   On: 09/17/2018 18:28   Mr Abdomen Mrcp Wo Contrast  Result Date: 09/17/2018 CLINICAL DATA:  Cholelithiasis.  Abnormal liver function tests. EXAM: MRI ABDOMEN WITHOUT CONTRAST  (INCLUDING MRCP) TECHNIQUE: Multiplanar multisequence MR imaging of the abdomen was performed. Heavily T2-weighted images of the biliary and pancreatic ducts were obtained, and three-dimensional MRCP images were rendered by post processing. COMPARISON:  09/15/2018 CT and ultrasound FINDINGS: Exam is severely degraded, nearly nondiagnostic. Limitations include motion artifact. Patient was unable to follow directions and requested to be taken out of the scanner. Lower chest: Mild cardiomegaly. Bibasilar airspace disease which is suboptimally evaluated but may be progressive on the left. Example image 3/7. Hepatobiliary: Hepatomegaly at 19 cm craniocaudal. No dominant liver lesion. No intrahepatic biliary duct dilatation. The gallbladder is mildly distended. Stones within including at up to 9 mm. The fundal wall is thickened at 6 mm on 23/7. No convincing evidence of pericholecystic edema. The common duct is normal in caliber, best evaluated on 21/2. No choledocholithiasis. Pancreas:  Normal, without mass or ductal dilatation. Spleen:  Normal in size, without focal abnormality. Adrenals/Urinary Tract: Normal adrenal glands. Normal kidneys for age, with expected cortical thinning but no hydronephrosis. Stomach/Bowel: Periampullary duodenal diverticulum. Otherwise grossly normal stomach and abdominal bowel loops. Vascular/Lymphatic: Normal caliber of the aorta and branch vessels. Prominent porta hepatis nodes including at 8 mm on 14/7. Likely reactive. Other: No ascites. Diffuse anasarca. Favor artifactual T2 hypointensity within the bladder on 20/2. No stone on recent CT. Musculoskeletal: Lumbosacral spondylosis with mild S-shaped spine curvature. IMPRESSION: 1. Severely degraded exam, as detailed above. 2. Cholelithiasis with  focal gallbladder wall thickening and mild gallbladder distension. Findings remain equivocal for acute cholecystitis. 3. No biliary duct dilatation or choledocholithiasis. 4. Hepatomegaly. 5. Bibasilar airspace disease, which may be progressive, especially on the left. Correlate with symptoms to suggest ongoing infection or aspiration. Electronically Signed   By: Abigail Miyamoto M.D.   On: 09/17/2018 11:49     Anti-infectives:   Anti-infectives (From admission, onward)   Start     Dose/Rate Route Frequency Ordered Stop   09/16/18 0400  piperacillin-tazobactam (ZOSYN) IVPB 3.375 g     3.375 g 12.5 mL/hr over 240 Minutes Intravenous Every 8 hours 09/15/18 2027     09/15/18 1545  piperacillin-tazobactam (ZOSYN) IVPB 3.375 g     3.375 g 100 mL/hr over 30 Minutes Intravenous  Once 09/15/18 1542 09/15/18 1954      Alphonsa Overall, MD, FACS Pager: [facility] Office: [phone] 09/18/2018

## 2018-09-18 NOTE — Op Note (Signed)
[facility] Patient Name: [name] Procedure Date: 09/18/2018 MRN: [id number] Attending MD: Otis Brace , MD Date of Birth: [birth year] CSN: [id number] Age: 85 Admit Type: Inpatient Procedure:                Upper GI endoscopy Indications:              Coffee-ground emesis Providers:                Otis Brace, MD, Burtis Junes, RN, Ladona Ridgel, Technician Referring MD:              Medicines:                Sedation Administered by an Anesthesia Professional Complications:            No immediate complications. Estimated Blood Loss:     Estimated blood loss was minimal. Procedure:                Pre-Anesthesia Assessment:                           - Prior to the procedure, a History and Physical                            was performed, and patient medications and                            allergies were reviewed. The patient's tolerance of                            previous anesthesia was also reviewed. The risks                            and benefits of the procedure and the sedation                            options and risks were discussed with the patient.                            All questions were answered, and informed consent                            was obtained. Prior Anticoagulants: The patient has                            taken no previous anticoagulant or antiplatelet                            agents. ASA Grade Assessment: III - A patient with                            severe systemic disease. After reviewing the risks  and benefits, the patient was deemed in                            satisfactory condition to undergo the procedure.                           After obtaining informed consent, the endoscope was                            passed under direct vision. Throughout the                            procedure, the patient's blood pressure, pulse, and         oxygen saturations were monitored continuously. The                            [id number]-[id number]) Olympus gastroscope was                            introduced through the mouth, and advanced to the                            second part of duodenum. The upper GI endoscopy was                            accomplished without difficulty. The patient                            tolerated the procedure well. Scope In: Scope Out: Findings:      The Z-line was regular and was found 36 cm from the incisors.      The exam of the esophagus was otherwise normal.      Scattered mild inflammation was found in the entire examined stomach.      A few small sessile polyps were found in the gastric fundus and in the       gastric body. Two polyps were removed with a cold biopsy forceps.       Resection and retrieval were complete.      The cardia and gastric fundus were normal on retroflexion.      The duodenal bulb, first portion of the duodenum and second portion of       the duodenum were normal.      There is no endoscopic evidence of bleeding or ulceration in the entire       examined stomach. Impression:               - Z-line regular, 36 cm from the incisors.                           - Gastritis.                           - A few gastric polyps. Resected and retrieved.                           - Normal duodenal bulb, first portion  of the                            duodenum and second portion of the duodenum. Moderate Sedation:      Moderate (conscious) sedation was personally administered by an       anesthesia professional. The following parameters were monitored: oxygen       saturation, heart rate, blood pressure, and response to care. Recommendation:           - Return patient to hospital ward for ongoing care.                           - Soft diet.                           - Continue present medications. Procedure Code(s):        --- Professional ---                            [id number], Esophagogastroduodenoscopy, flexible,                            transoral; with biopsy, single or multiple Diagnosis Code(s):        --- Professional ---                           K29.70, Gastritis, unspecified, without bleeding                           K31.7, Polyp of stomach and duodenum                           K92.0, Hematemesis CPT copyright 2019 American Medical Association. All rights reserved. The codes documented in this report are preliminary and upon coder review may  be revised to meet current compliance requirements. Otis Brace, MD Otis Brace, MD 09/18/2018 10:29:27 AM Number of Addenda: 0

## 2018-09-18 NOTE — Brief Op Note (Signed)
09/15/2018 - 09/18/2018  10:33 AM  PATIENT:  [name]  85 y.o. female  PRE-OPERATIVE DIAGNOSIS:  Coffee-ground emesis  POST-OPERATIVE DIAGNOSIS:  * No post-op diagnosis entered *  PROCEDURE:  Procedure(s): ESOPHAGOGASTRODUODENOSCOPY (EGD) WITH PROPOFOL (N/A) BIOPSY  SURGEON:  Surgeon(s) and Role:    * Luiz Trumpower, MD - Primary   Findings ---------- -EGD showed mild gastritis and few small gastric polyps.  No evidence of ulcer disease or active bleeding.  Recommendations ---------------------- -Start soft diet -Change IV Protonix to once a day p.o. pantoprazole -Findings discussed with patient's daughter over the phone. -No further inpatient GI work-up planned. -GI will sign off.  please call us back if needed  Otis Brace MD, FACP 09/18/2018, 10:34 AM  Contact #  [phone]

## 2018-09-18 NOTE — Anesthesia Procedure Notes (Signed)
Procedure Name: MAC Date/Time: 09/18/2018 10:06 AM Performed by: Eben Burow, CRNA Pre-anesthesia Checklist: Patient identified, Emergency Drugs available, Suction available, Patient being monitored and Timeout performed Oxygen Delivery Method: Nasal cannula Dental Injury: Teeth and Oropharynx as per pre-operative assessment

## 2018-09-18 NOTE — Transfer of Care (Signed)
Immediate Anesthesia Transfer of Care Note  Patient: [name]  Procedure(s) Performed: ESOPHAGOGASTRODUODENOSCOPY (EGD) WITH PROPOFOL (N/A ) BIOPSY  Patient Location: PACU and Endoscopy Unit  Anesthesia Type:MAC  Level of Consciousness: confused  Airway & Oxygen Therapy: Patient Spontanous Breathing and Patient connected to nasal cannula oxygen  Post-op Assessment: Report given to RN and Post -op Vital signs reviewed and stable  Post vital signs: Reviewed and stable  Last Vitals:  Vitals Value Taken Time  BP 130/44 09/18/18 1030  Temp 36.2 C 09/18/18 1030  Pulse 74 09/18/18 1036  Resp 12 09/18/18 1036  SpO2 97 % 09/18/18 1036  Vitals shown include unvalidated device data.  Last Pain:  Vitals:   09/18/18 1030  TempSrc: Temporal  PainSc:          Complications: No apparent anesthesia complications

## 2018-09-19 ENCOUNTER — Encounter (HOSPITAL_COMMUNITY): Payer: Self-pay | Admitting: Gastroenterology

## 2018-09-19 LAB — BASIC METABOLIC PANEL
Anion gap: 11 (ref 5–15)
BUN: 17 mg/dL (ref 8–23)
CO2: 25 mmol/L (ref 22–32)
Calcium: 8.2 mg/dL — ABNORMAL LOW (ref 8.9–10.3)
Chloride: 105 mmol/L (ref 98–111)
Creatinine, Ser: 1.18 mg/dL — ABNORMAL HIGH (ref 0.44–1.00)
GFR calc Af Amer: 48 mL/min — ABNORMAL LOW (ref >60)
GFR calc non Af Amer: 41 mL/min — ABNORMAL LOW (ref >60)
Glucose, Bld: 101 mg/dL — ABNORMAL HIGH (ref 70–99)
Potassium: 3 mmol/L — ABNORMAL LOW (ref 3.5–5.1)
Sodium: 141 mmol/L (ref 135–145)

## 2018-09-19 LAB — CBC
HCT: 31.1 % — ABNORMAL LOW (ref 36.0–46.0)
Hemoglobin: 10.2 g/dL — ABNORMAL LOW (ref 12.0–15.0)
MCH: 31.2 pg (ref 26.0–34.0)
MCHC: 32.8 g/dL (ref 30.0–36.0)
MCV: 95.1 fL (ref 80.0–100.0)
Platelets: 277 10*3/uL (ref 150–400)
RBC: 3.27 MIL/uL — ABNORMAL LOW (ref 3.87–5.11)
RDW: 13.4 % (ref 11.5–15.5)
WBC: 8.8 10*3/uL (ref 4.0–10.5)
nRBC: 0 % (ref 0.0–0.2)

## 2018-09-19 LAB — COMPREHENSIVE METABOLIC PANEL
ALT: 37 U/L (ref 0–44)
AST: 23 U/L (ref 15–41)
Albumin: 2.6 g/dL — ABNORMAL LOW (ref 3.5–5.0)
Alkaline Phosphatase: 136 U/L — ABNORMAL HIGH (ref 38–126)
Anion gap: 12 (ref 5–15)
BUN: 15 mg/dL (ref 8–23)
CO2: 24 mmol/L (ref 22–32)
Calcium: 8.3 mg/dL — ABNORMAL LOW (ref 8.9–10.3)
Chloride: 104 mmol/L (ref 98–111)
Creatinine, Ser: 1.17 mg/dL — ABNORMAL HIGH (ref 0.44–1.00)
GFR calc Af Amer: 48 mL/min — ABNORMAL LOW (ref >60)
GFR calc non Af Amer: 42 mL/min — ABNORMAL LOW (ref >60)
Glucose, Bld: 110 mg/dL — ABNORMAL HIGH (ref 70–99)
Potassium: 3.4 mmol/L — ABNORMAL LOW (ref 3.5–5.1)
Sodium: 140 mmol/L (ref 135–145)
Total Bilirubin: 1.8 mg/dL — ABNORMAL HIGH (ref 0.3–1.2)
Total Protein: 5.8 g/dL — ABNORMAL LOW (ref 6.5–8.1)

## 2018-09-19 MED ORDER — AMOXICILLIN-POT CLAVULANATE 500-125 MG PO TABS
1.0000 | ORAL_TABLET | Freq: Two times a day (BID) | ORAL | Status: DC
  Administered 2018-09-19 – 2018-09-21 (×5): 500 mg via ORAL
  Filled 2018-09-19 (×6): qty 1

## 2018-09-19 MED ORDER — MECLIZINE HCL 25 MG PO TABS: 25.0000 mg | ORAL_TABLET | Freq: Three times a day (TID) | ORAL | Status: DC | PRN

## 2018-09-19 MED ORDER — PREGABALIN 75 MG PO CAPS
75.0000 mg | ORAL_CAPSULE | Freq: Two times a day (BID) | ORAL | Status: DC
  Administered 2018-09-19: 75 mg via ORAL
  Filled 2018-09-19: qty 1

## 2018-09-19 MED ORDER — QUETIAPINE FUMARATE 25 MG PO TABS
12.5000 mg | ORAL_TABLET | Freq: Two times a day (BID) | ORAL | Status: DC
  Administered 2018-09-19: 12.5 mg via ORAL
  Filled 2018-09-19: qty 1

## 2018-09-19 MED ORDER — PREGABALIN 50 MG PO CAPS
50.0000 mg | ORAL_CAPSULE | Freq: Two times a day (BID) | ORAL | Status: DC
  Administered 2018-09-19 – 2018-09-21 (×4): 50 mg via ORAL
  Filled 2018-09-19 (×4): qty 1

## 2018-09-19 NOTE — Progress Notes (Signed)
[facility] Progress Note  Patient: [name] [id number]   PCP: Earney Mallet, MD DOB: 11-10-1927   DOA: 09/15/2018   DOS: 09/19/2018   Date of Service: the patient was seen and examined on 09/19/2018  Brief hospital course: Pt. with PMH of HTN, CKD, prior GI bleed; admitted on 09/15/2018, presented with complaint of coffee colored emesis and fever, was found to have acute cholecystitis as well as possible upper GI bleed and acute kidney injury. Currently further plan is continue IV antibiotics and further work-up depending on GI and general surgery recommendation.  Subjective: Agitated last night. Unable to sleep at night. Was significantly agitated even in the morning of the time of my evaluation. Calm down later. Sleepy and lethargic later in the afternoon.  Assessment and Plan: 1. Acute upper GI bleed GI consulted. H&H relatively stable for now. S/p EGD 09/18/2018 no active bleeding, ulcer. Gastritis. GI signed off. Currently on Protonix.  2.  Acute cholecystitis. General surgery consulted. Elevated LFTs. CT scan shows no evidence of elevated dilated CBD. Initially on IV Zosyn.  Transition to oral Augmentin MRCP was done on 08/29 and negative for any CBD stone Surgery recommends conservative management with antibiotics for 14 days. Outpatient follow-up recommended.  3.  Acute kidney injury. In the setting of dehydration from vomiting as well as active infection. Patient was given aggressive IV hydration. Avoid nephrotoxic medications. Monitor renal function. Renal function getting better. CRCL 33.   4.  Chronic neuropathy pain. Patient is on Lyrica it was on hold until 09/19/2018. Patient received 1 dose of 75-mg and becomes sleepy and lethargic. We will reduce to 50 nightly daily.  5.  Hypotension. Multifactorial. In the setting of infection as well as hemorrhage. Monitor.  Improving.  6.  Possible aspiration pneumonia. In the setting of  vomiting. Patient is already on Zosyn. We will continue for now. No growth in blood cultures. No hypoxia.  7.  New onset paroxysmal A. fib. Rate controlled. Patient fluctuates between in and out of A. fib and normal sinus rhythm. Currently will monitor. Contraindication to anticoagulation given her GI bleed.  8 Acute onset peripheral vertigo. Sundowning and delirium Acute metabolic encephalopathy Do not think that this is actually acute stroke. Patient symptomatic although no nystagmus identified on my examination. Concern that this is sundowning and delirium in a patient with chronic dementia. Stat CT head negative, no further neuro deficit.  Continue meclizine. Treated with Seroquel nightly.  Will discontinue due to sleepiness.  Diet: soft diet DVT Prophylaxis: SCD, pharmacological prophylaxis contraindicated due to Acute GI bleed  Advance goals of care discussion: Full code  Family Communication: no family was present at bedside, at the time of interview. The pt provided permission to discuss medical plan with the family. Opportunity was given to ask question and all questions were answered satisfactorily.   Disposition:  Discharge to Home  Consultants: gastroenterology  General surgery  Procedures: EGD   Scheduled Meds: . amoxicillin-clavulanate  1 tablet Oral BID  . Chlorhexidine Gluconate Cloth  6 each Topical Daily  . mirabegron ER  50 mg Oral Daily  . pantoprazole  40 mg Oral Daily  . pregabalin  50 mg Oral BID  . QUEtiapine  12.5 mg Oral BID  . sodium chloride flush  3 mL Intravenous Q12H   Continuous Infusions:  PRN Meds: acetaminophen **OR** acetaminophen, meclizine, ondansetron **OR** ondansetron (ZOFRAN) IV Antibiotics: Anti-infectives (From admission, onward)   Start     Dose/Rate Route Frequency Ordered Stop  09/19/18 1000  amoxicillin-clavulanate (AUGMENTIN) 500-125 MG per tablet 500 mg     1 tablet Oral 2 times daily 09/19/18 0933     09/16/18  0400  piperacillin-tazobactam (ZOSYN) IVPB 3.375 g  Status:  Discontinued     3.375 g 12.5 mL/hr over 240 Minutes Intravenous Every 8 hours 09/15/18 2027 09/19/18 0933   09/15/18 1545  piperacillin-tazobactam (ZOSYN) IVPB 3.375 g     3.375 g 100 mL/hr over 30 Minutes Intravenous  Once 09/15/18 1542 09/15/18 1954       Objective: Physical Exam: Vitals:   09/18/18 1538 09/18/18 2017 09/19/18 0420 09/19/18 1500  BP: (!) 148/70 (!) 145/49 130/70 138/75  Pulse: 80 65 85 83  Resp: 16 14 16 16   Temp: 97.6 F (36.4 C) (!) 97.5 F (36.4 C) 99.2 F (37.3 C) 99 F (37.2 C)  TempSrc:  Oral Oral Oral  SpO2: 98% 96% 97% 96%  Weight:   68.2 kg   Height:        Intake/Output Summary (Last 24 hours) at 09/19/2018 1724 Last data filed at 09/19/2018 1529 Gross per 24 hour  Intake 4018.98 ml  Output 1300 ml  Net 2718.98 ml   Filed Weights   09/17/18 0548 09/18/18 0624 09/19/18 0420  Weight: 71.5 kg 68.2 kg 68.2 kg   General: alert and oriented to time, place, and person. Appear in mild distress, affect appropriate Eyes: PERRL, Conjunctiva normal ENT: Oral Mucosa Clear, moist  Neck: no JVD, no Abnormal Mass Or lumps Cardiovascular: S1 and S2 Present, no Murmur, peripheral pulses symmetrical Respiratory: normal respiratory effort, Bilateral Air entry equal and Decreased, no use of accessory muscle, Clear to Auscultation, no Crackles, no wheezes Abdomen: Bowel Sound present, Soft and mild tenderness, no hernia Skin: no rashes  Extremities: no Pedal edema, no calf tenderness Neurologic: normal without focal findings, mental status, speech normal, alert and oriented x3, PERLA, Motor strength 5/5 and symmetric and sensation grossly normal to light touch Gait not checked due to patient safety concerns  Data Reviewed: CBC: Recent Labs  Lab 09/15/18 1254 09/16/18 0154 09/17/18 0355 09/18/18 0449 09/19/18 0752  WBC 18.2* 10.4 10.8* 9.1 8.8  NEUTROABS 16.2*  --  9.0* 6.6  --   HGB 11.6*  9.5* 9.5* 10.1* 10.2*  HCT 35.1* 28.0* 29.7* 31.8* 31.1*  MCV 93.9 92.4 95.8 96.4 95.1  PLT 286 175 236 279 [id number]   Basic Metabolic Panel: Recent Labs  Lab 09/15/18 1254 09/16/18 0154 09/17/18 0355 09/18/18 0449 09/19/18 0752  NA 140 144 141 140 140  K 3.4* 3.2* 3.8 3.2* 3.4*  CL 108 116* 111 106 104  CO2 22 18* 20* 23 24  GLUCOSE 155* 112* 90 88 110*  BUN 35* 35* 22 18 15   CREATININE 2.06* 1.51* 1.16* 1.20* 1.17*  CALCIUM 8.5* 8.0* 8.4* 8.4* 8.3*  MG  --  1.8  --   --   --     Liver Function Tests: Recent Labs  Lab 09/15/18 1254 09/16/18 0154 09/17/18 0355 09/18/18 0449 09/19/18 0752  AST 177* 103* 47* 28 23  ALT 130* 101* 70* 52* 37  ALKPHOS 271* 198* 181* 159* 136*  BILITOT 5.7* 5.0* 2.7* 2.4* 1.8*  PROT 6.7 5.6* 5.8* 6.2* 5.8*  ALBUMIN 3.0* 2.6* 2.6* 2.8* 2.6*   Recent Labs  Lab 09/15/18 1254  LIPASE 27   No results for input(s): AMMONIA in the last 168 hours. Coagulation Profile: Recent Labs  Lab 09/15/18 1254  INR 1.1   Cardiac  Enzymes: No results for input(s): CKTOTAL, CKMB, CKMBINDEX, TROPONINI in the last 168 hours. BNP (last 3 results) No results for input(s): PROBNP in the last 8760 hours. CBG: Recent Labs  Lab 09/16/18 0748  GLUCAP 99   Studies: No results found.   Time spent: 35 minutes  Author: Berle Mull, MD [facility] 09/19/2018 5:24 PM  To reach On-call, see care teams to locate the attending and reach out to them via [email or web address]. If 7PM-7AM, please contact night-coverage If you still have difficulty reaching the attending provider, please page the [facility] (Director on Call) for [facility] on amion for assistance.

## 2018-09-19 NOTE — Progress Notes (Addendum)
1 Day Post-Op  Subjective: CC: Patient excited that tomorrow is her birthday. She reports she has had no abdominal pain for the last 24 hours. She was able to have what sounds like cream of chicken soup yesterday for dinner without any abdominal pain, n/v. Has not had breakfast yet as she was sleeping prior to me coming in. No fever or chills. She seems less confused. She is orientated to place, time, and self. She is aware she is in the hospital for her abdomen but not exactly sure why.   Objective: Vital signs in last 24 hours: Temp:  [97.1 F (36.2 C)-99.2 F (37.3 C)] 99.2 F (37.3 C) (08/31 0420) Pulse Rate:  [65-89] 85 (08/31 0420) Resp:  [12-16] 16 (08/31 0420) BP: (130-157)/(41-79) 130/70 (08/31 0420) SpO2:  [95 %-99 %] 97 % (08/31 0420) Weight:  [68.2 kg] 68.2 kg (08/31 0420) Last BM Date: 09/17/18  Intake/Output from previous day: 08/30 0701 - 08/31 0700 In: 3432.2 [P.O.:600; I.V.:2705.7; IV Piggyback:126.5] Out: [id number] [Urine:1650] Intake/Output this shift: No intake/output data recorded.  PE: Gen:  Alert, NAD, pleasant Pulm:  CTA b/l, rate and effort normal Abd: Soft, NT/ND, +BS Ext:  No LE edema Psych: Orientated to place, time and self Skin: no rashes noted, warm and dry  Lab Results:  Recent Labs    09/18/18 0449 09/19/18 0752  WBC 9.1 8.8  HGB 10.1* 10.2*  HCT 31.8* 31.1*  PLT 279 277   BMET Recent Labs    09/17/18 0355 09/18/18 0449  NA 141 140  K 3.8 3.2*  CL 111 106  CO2 20* 23  GLUCOSE 90 88  BUN 22 18  CREATININE 1.16* 1.20*  CALCIUM 8.4* 8.4*   PT/INR No results for input(s): LABPROT, INR in the last 72 hours. CMP     Component Value Date/Time   NA 140 09/18/2018 0449   K 3.2 (L) 09/18/2018 0449   CL 106 09/18/2018 0449   CO2 23 09/18/2018 0449   GLUCOSE 88 09/18/2018 0449   BUN 18 09/18/2018 0449   CREATININE 1.20 (H) 09/18/2018 0449   CALCIUM 8.4 (L) 09/18/2018 0449   PROT 6.2 (L) 09/18/2018 0449   ALBUMIN 2.8 (L)  09/18/2018 0449   AST 28 09/18/2018 0449   ALT 52 (H) 09/18/2018 0449   ALKPHOS 159 (H) 09/18/2018 0449   BILITOT 2.4 (H) 09/18/2018 0449   GFRNONAA 40 (L) 09/18/2018 0449   GFRAA 47 (L) 09/18/2018 0449   Lipase     Component Value Date/Time   LIPASE 27 09/15/2018 1254       Studies/Results: Ct Head Wo Contrast  Result Date: 09/17/2018 CLINICAL DATA:  85 year old female with ataxia, ?The room is spinning?Marland Kitchen EXAM: CT HEAD WITHOUT CONTRAST TECHNIQUE: Contiguous axial images were obtained from the base of the skull through the vertex without intravenous contrast. COMPARISON:  None. FINDINGS: Brain: Cerebral volume is within normal limits for age. No midline shift, mass effect, or evidence of intracranial mass lesion. No ventriculomegaly. Mild dural calcifications, including at the right operculum. No acute intracranial hemorrhage identified. Patchy cerebral white matter hypodensity mostly in the anterior frontal lobes. No cortical encephalomalacia. No cortically based acute infarct identified. Vascular: Calcified atherosclerosis at the skull base. Right MCA calcified plaque. No suspicious intracranial vascular hyperdensity. Skull: Negative. Partially visible upper cervical spine degeneration. Sinuses/Orbits: Scattered mild paranasal sinus mucosal thickening, well pneumatized sinuses overall. Bilateral tympanic cavities and mastoids appear clear. Other: No acute orbit or scalp soft tissue findings. IMPRESSION: 1.  No acute intracranial abnormality. 2. Mild to moderate for age nonspecific cerebral white matter changes, most commonly due to chronic small vessel disease. 3. Mild paranasal sinus disease, significance doubtful. Electronically Signed   By: Genevie Ann M.D.   On: 09/17/2018 18:28   Mr Abdomen Mrcp Wo Contrast  Result Date: 09/17/2018 CLINICAL DATA:  Cholelithiasis.  Abnormal liver function tests. EXAM: MRI ABDOMEN WITHOUT CONTRAST  (INCLUDING MRCP) TECHNIQUE: Multiplanar multisequence MR  imaging of the abdomen was performed. Heavily T2-weighted images of the biliary and pancreatic ducts were obtained, and three-dimensional MRCP images were rendered by post processing. COMPARISON:  09/15/2018 CT and ultrasound FINDINGS: Exam is severely degraded, nearly nondiagnostic. Limitations include motion artifact. Patient was unable to follow directions and requested to be taken out of the scanner. Lower chest: Mild cardiomegaly. Bibasilar airspace disease which is suboptimally evaluated but may be progressive on the left. Example image 3/7. Hepatobiliary: Hepatomegaly at 19 cm craniocaudal. No dominant liver lesion. No intrahepatic biliary duct dilatation. The gallbladder is mildly distended. Stones within including at up to 9 mm. The fundal wall is thickened at 6 mm on 23/7. No convincing evidence of pericholecystic edema. The common duct is normal in caliber, best evaluated on 21/2. No choledocholithiasis. Pancreas:  Normal, without mass or ductal dilatation. Spleen:  Normal in size, without focal abnormality. Adrenals/Urinary Tract: Normal adrenal glands. Normal kidneys for age, with expected cortical thinning but no hydronephrosis. Stomach/Bowel: Periampullary duodenal diverticulum. Otherwise grossly normal stomach and abdominal bowel loops. Vascular/Lymphatic: Normal caliber of the aorta and branch vessels. Prominent porta hepatis nodes including at 8 mm on 14/7. Likely reactive. Other: No ascites. Diffuse anasarca. Favor artifactual T2 hypointensity within the bladder on 20/2. No stone on recent CT. Musculoskeletal: Lumbosacral spondylosis with mild S-shaped spine curvature. IMPRESSION: 1. Severely degraded exam, as detailed above. 2. Cholelithiasis with focal gallbladder wall thickening and mild gallbladder distension. Findings remain equivocal for acute cholecystitis. 3. No biliary duct dilatation or choledocholithiasis. 4. Hepatomegaly. 5. Bibasilar airspace disease, which may be progressive,  especially on the left. Correlate with symptoms to suggest ongoing infection or aspiration. Electronically Signed   By: Abigail Miyamoto M.D.   On: 09/17/2018 11:49    Anti-infectives: Anti-infectives (From admission, onward)   Start     Dose/Rate Route Frequency Ordered Stop   09/16/18 0400  piperacillin-tazobactam (ZOSYN) IVPB 3.375 g     3.375 g 12.5 mL/hr over 240 Minutes Intravenous Every 8 hours 09/15/18 2027     09/15/18 1545  piperacillin-tazobactam (ZOSYN) IVPB 3.375 g     3.375 g 100 mL/hr over 30 Minutes Intravenous  Once 09/15/18 1542 09/15/18 1954       Assessment/Plan HTN CKD - Cr 1.17 Chronic neuropathy pain History of left hip surgery with left leg injury in 2009 Early dementia - appears improved this morning GI Bleed - EGD on 8/30 showed no ulcers, just mild gastritis. Hgb stable at 10.2 this AM   Cholecystitis, cholelithiasis Elevated LFTs - Negative MRCP 8/29 - On IV Zosyn - Tolerating a soft diet  - WBC normalized. LFTs continue to downtrend - She appears to have improved on abx. Will plan for her to follow up with Dr. Marlou Starks to discuss interval cholecystectomy in 4-6 weeks.  - Okay for discharge from a surgical standpoint. Will need a total of 14 days of abx   FEN - Soft diet VTE - SCDs, okay for chemical prophylaxis from a surgical standpoint ID - Zosyn 8/27 >>   LOS: 4 days  Jillyn Ledger , [facility] 09/19/2018, 9:00 AM Pager: [phone]

## 2018-09-19 NOTE — Care Management Important Message (Signed)
Important Message  Patient Details IM Letter given to [name] SW to present to the Patient Name: [name] MRN: [id number] Date of Birth: April 17, 1927   Medicare Important Message Given:  Yes     Kerin Salen 09/19/2018, 11:47 AM

## 2018-09-19 NOTE — Discharge Instructions (Signed)
Cholecystitis  Cholecystitis is inflammation of the gallbladder. It is often called a gallbladder attack. The gallbladder is a pear-shaped organ that lies beneath the liver on the right side of the body. The gallbladder stores bile, which is a fluid that helps the body digest fats. If bile builds up in your gallbladder, your gallbladder becomes inflamed. This condition may occur suddenly. Cholecystitis is a serious condition and requires treatment. What are the causes? The most common cause of this condition is gallstones. Gallstones can block the tube (duct) that carries bile out of your gallbladder. This causes bile to build up. Other causes include:  Damage to the gallbladder due to a decrease in blood flow.  Infections in the bile ducts.  Scars or kinks in the bile ducts.  Tumors in the liver, pancreas, or gallbladder. What increases the risk? You are more likely to develop this condition if:  You have sickle cell disease.  You take birth control pills or use estrogen.  You have alcoholic liver disease.  You have liver cirrhosis.  You have your nutrition delivered through a vein (parenteral nutrition).  You are critically ill.  You do not eat or drink for a long time. This is also called "fasting."  You are obese.  You lose weight too fast.  You are pregnant.  You have high levels of fat (triglycerides) in the blood.  You have pancreatitis. What are the signs or symptoms? Symptoms of this condition include:  Pain in the abdomen, especially in the upper right area of the abdomen.  Tenderness or bloating in the abdomen.  Nausea.  Vomiting.  Fever.  Chills. How is this diagnosed? This condition is diagnosed with a medical history and physical exam. You may also have other tests, including:  Imaging tests, such as: ? An ultrasound of the gallbladder. ? A CT scan of the abdomen. ? A gallbladder nuclear scan (HIDA scan). This scan allows your health care  provider to see the bile moving from your liver to your gallbladder and on to your small intestine. ? MRI.  Blood tests, such as: ? A complete blood count. The white blood cell count may be higher than normal. ? Liver function tests. Certain types of gallstones cause some results to be higher than normal. How is this treated? Treatment may include:  Surgery to remove your gallbladder (cholecystectomy).  Antibiotic medicine, usually through an IV.  Fasting for a certain amount of time.  Giving IV fluids.  Medicine to treat pain or vomiting. Follow these instructions at home:  If you had surgery, follow instructions from your health care provider about home care after the procedure. Medicines   Take over-the-counter and prescription medicines only as told by your health care provider.  If you were prescribed an antibiotic medicine, take it as told by your health care provider. Do not stop taking the antibiotic even if you start to feel better. General instructions  Follow instructions from your health care provider about what to eat or drink. When you are allowed to eat, avoid eating or drinking anything that triggers your symptoms.  Do not lift anything that is heavier than 10 lb (4.5 kg), or the limit that you are told, until your health care provider says that it is safe.  Do not use any products that contain nicotine or tobacco, such as cigarettes and e-cigarettes. If you need help quitting, ask your health care provider.  Keep all follow-up visits as told by your health care provider. This  is important. Contact a health care provider if:  Your pain is not controlled with medicine.  You have a fever. Get help right away if:  Your pain moves to another part of your abdomen or to your back.  You continue to have symptoms or you develop new symptoms even with treatment. Summary  Cholecystitis is inflammation of the gallbladder.  The most common cause of this condition  is gallstones. Gallstones can block the tube (duct) that carries bile out of your gallbladder.  Common symptoms are pain in the abdomen, nausea, vomiting, fever, and chills.  This condition is treated with surgery to remove the gallbladder, medicines, fasting, and IV fluids.  Follow your health care provider's instructions for eating and drinking. Avoid eating anything that triggers your symptoms. This information is not intended to replace advice given to you by your health care provider. Make sure you discuss any questions you have with your health care provider. Document Released: 01/05/2005 Document Revised: 05/14/2017 Document Reviewed: 05/14/2017 Elsevier Patient Education  [place].   Please take all of your antibiotics until finished!   You may develop abdominal discomfort or diarrhea from the antibiotic.  You may help offset this with probiotics which you can buy or get in yogurt. Do not eat or take the probiotics until 2 hours after your antibiotic. Do not take your medicine if develop an itchy rash, swelling in your mouth or lips, or difficulty breathing.   Gallbladder Eating Plan If you have a gallbladder condition, you may have trouble digesting fats. Eating a low-fat diet can help reduce your symptoms, and may be helpful before and after having surgery to remove your gallbladder (cholecystectomy). Your health care provider may recommend that you work with a diet and nutrition specialist (dietitian) to help you reduce the amount of fat in your diet. What are tips for following this plan? General guidelines  Limit your fat intake to less than 30% of your total daily calories. If you eat around 1,800 calories each day, this is less than 60 grams (g) of fat per day.  Fat is an important part of a healthy diet. Eating a low-fat diet can make it hard to maintain a healthy body weight. Ask your dietitian how much fat, calories, and other nutrients you need each day.  Eat  small, frequent meals throughout the day instead of three large meals.  Drink at least 8-10 cups of fluid a day. Drink enough fluid to keep your urine clear or pale yellow.  Limit alcohol intake to no more than 1 drink a day for nonpregnant women and 2 drinks a day for men. One drink equals 12 oz of beer, 5 oz of wine, or 1 oz of hard liquor. Reading food labels  Check Nutrition Facts on food labels for the amount of fat per serving. Choose foods with less than 3 grams of fat per serving. Shopping  Choose nonfat and low-fat healthy foods. Look for the words nonfat, low fat, or fat free.  Avoid buying processed or prepackaged foods. Cooking  Cook using low-fat methods, such as baking, broiling, grilling, or boiling.  Cook with small amounts of healthy fats, such as olive oil, grapeseed oil, canola oil, or sunflower oil. What foods are recommended?   All fresh, frozen, or canned fruits and vegetables.  Whole grains.  Low-fat or non-fat (skim) milk and yogurt.  Lean meat, skinless poultry, fish, eggs, and beans.  Low-fat protein supplement powders or drinks.  Spices and herbs. What  foods are not recommended?  High-fat foods. These include baked goods, fast food, fatty cuts of meat, ice cream, french toast, sweet rolls, pizza, cheese bread, foods covered with butter, creamy sauces, or cheese.  Fried foods. These include french fries, tempura, battered fish, breaded chicken, fried breads, and sweets.  Foods with strong odors.  Foods that cause bloating and gas. Summary  A low-fat diet can be helpful if you have a gallbladder condition, or before and after gallbladder surgery.  Limit your fat intake to less than 30% of your total daily calories. This is about 60 g of fat if you eat 1,800 calories each day.  Eat small, frequent meals throughout the day instead of three large meals. This information is not intended to replace advice given to you by your health care  provider. Make sure you discuss any questions you have with your health care provider. Document Released: 01/10/2013 Document Revised: 04/28/2018 Document Reviewed: 02/13/2016 Elsevier Patient Education  2020 [employer].

## 2018-09-19 NOTE — Progress Notes (Signed)
PT Cancellation Note  Patient Details Name: FREYA RADERMACHER MRN: [id number] DOB: [birth year]   Cancelled Treatment:    Reason Eval/Treat Not Completed: Patient declined, no reason specified;Patient's level of consciousness Returned for second attempt at PT evaluation. Patient remains asleep, very lethargic; attempted to rouse with verbal and tactile stimulation, brought B LEs off of side of bed with totalA and patient became hysterical, muttering incomprehensible statements and crying. Returned legs back to bed with totalA and patient calmed back down, returned to sleep.   Spoke to RN, who reports lethargy may be due to Seroquel given this morning as patient was alert and interactive before that medication was given.   Will attempt to perform formal evaluation as time/schedule allow on next day of service at this point.    Deniece Ree PT, DPT, CBIS  Supplemental Physical Therapist [facility]    Pager [phone] Acute Rehab Office [phone]

## 2018-09-19 NOTE — Progress Notes (Signed)
PHARMACY NOTE:  ANTIMICROBIAL RENAL DOSAGE ADJUSTMENT  Current antimicrobial regimen includes a mismatch between antimicrobial dosage and estimated renal function.  As per policy approved by the Pharmacy & Therapeutics and Medical Executive Committees, the antimicrobial dosage will be adjusted accordingly.  Current antimicrobial dosage:  augmentin 500mg  po tid  Indication: cholecystitis  Renal Function:  Estimated Creatinine Clearance: 29.9 mL/min (A) (by C-G formula based on SCr of 1.17 mg/dL (H)). []      On intermittent HD, scheduled: []      On CRRT    Antimicrobial dosage has been changed to:  augmentin 500mg  bid  Additional comments:   Thank you for allowing pharmacy to be a part of this patient's care.  Dolly Rias RPh 09/19/2018, 9:42 AM Pager [phone]

## 2018-09-19 NOTE — Progress Notes (Signed)
PT Cancellation Note  Patient Details Name: [name] MRN: [id number] DOB: August 24, 1927   Cancelled Treatment:    Reason Eval/Treat Not Completed: Patient declined, no reason specified attempted to perform eval, patient with eyes closed, states "mm-hmmm" to questions and verbal/tactile stimulation but remains with eyes closed appearing to sleep. Unable to facilitate participation in therapy session. Will attempt to try back if time/schedule allow.    Deniece Ree PT, DPT, CBIS  Supplemental Physical Therapist [facility]    Pager [phone] Acute Rehab Office [phone]

## 2018-09-19 NOTE — Progress Notes (Signed)
Central telemetry called and informed me that patient had some junctional escape beats, PJC's and PAC around 1740. Then a few minutes ago she had 2 separate sinus pauses of 1.33 and 1.32. I notified Dr. Posey Pronto and he has ordered a BMP to check her electrolytes. Patient is resting quietly in her bed.

## 2018-09-20 LAB — COMPREHENSIVE METABOLIC PANEL
ALT: 28 U/L (ref 0–44)
AST: 17 U/L (ref 15–41)
Albumin: 2.5 g/dL — ABNORMAL LOW (ref 3.5–5.0)
Alkaline Phosphatase: 114 U/L (ref 38–126)
Anion gap: 7 (ref 5–15)
BUN: 15 mg/dL (ref 8–23)
CO2: 26 mmol/L (ref 22–32)
Calcium: 8.1 mg/dL — ABNORMAL LOW (ref 8.9–10.3)
Chloride: 106 mmol/L (ref 98–111)
Creatinine, Ser: 1.02 mg/dL — ABNORMAL HIGH (ref 0.44–1.00)
GFR calc Af Amer: 56 mL/min — ABNORMAL LOW (ref >60)
GFR calc non Af Amer: 49 mL/min — ABNORMAL LOW (ref >60)
Glucose, Bld: 101 mg/dL — ABNORMAL HIGH (ref 70–99)
Potassium: 2.9 mmol/L — ABNORMAL LOW (ref 3.5–5.1)
Sodium: 139 mmol/L (ref 135–145)
Total Bilirubin: 1.3 mg/dL — ABNORMAL HIGH (ref 0.3–1.2)
Total Protein: 5.7 g/dL — ABNORMAL LOW (ref 6.5–8.1)

## 2018-09-20 LAB — CULTURE, BLOOD (ROUTINE X 2)
Culture: NO GROWTH
Culture: NO GROWTH

## 2018-09-20 LAB — CBC
HCT: 30.2 % — ABNORMAL LOW (ref 36.0–46.0)
Hemoglobin: 9.5 g/dL — ABNORMAL LOW (ref 12.0–15.0)
MCH: 30.4 pg (ref 26.0–34.0)
MCHC: 31.5 g/dL (ref 30.0–36.0)
MCV: 96.8 fL (ref 80.0–100.0)
Platelets: 278 10*3/uL (ref 150–400)
RBC: 3.12 MIL/uL — ABNORMAL LOW (ref 3.87–5.11)
RDW: 13.3 % (ref 11.5–15.5)
WBC: 7.9 10*3/uL (ref 4.0–10.5)
nRBC: 0 % (ref 0.0–0.2)

## 2018-09-20 MED ORDER — POTASSIUM CHLORIDE CRYS ER 20 MEQ PO TBCR
40.0000 meq | EXTENDED_RELEASE_TABLET | ORAL | Status: AC
  Administered 2018-09-20 (×2): 40 meq via ORAL
  Filled 2018-09-20 (×2): qty 2

## 2018-09-20 NOTE — Evaluation (Signed)
Physical Therapy Evaluation Patient Details Name: [name] MRN: [id number] DOB: 1927-02-18 Today's Date: 09/20/2018   History of Present Illness  85yo female c/o abdominal pain and vomiting, coffee ground emesis. Hypotensive in ED. Covid negative. Admitted for possible GI bleed. PMH back pain, CA, gout, drop foot, THA  Clinical Impression   Patient received in bed, awake and alert today and able to provide some basic history but details limited. She required MinA for functional bed mobility, however required Mod-MaxA for repeated sit to stands for which assist increased with fatigue. Attempted sidesteps along side EOB however patient repeatedly refused due to fatigue, only able to perform static standing today for periods no longer than 10 seconds at a time. She was returned to bed and left in bed with lunch set up and positioned to comfort, all needs otherwise met and bed alarm active. RN and MD educated regarding mobility level and PT recommendations.     Follow Up Recommendations SNF    Equipment Recommendations  None recommended by PT    Recommendations for Other Services       Precautions / Restrictions Precautions Precautions: Fall Restrictions Weight Bearing Restrictions: No      Mobility  Bed Mobility Overal bed mobility: Needs Assistance Bed Mobility: Supine to Sit;Sit to Supine     Supine to sit: Min assist Sit to supine: Min assist   General bed mobility comments: MinA to swing LEs around and to bring trunk up to upright sitting  Transfers Overall transfer level: Needs assistance Equipment used: 1 person hand held assist Transfers: Sit to/from Stand Sit to Stand: Mod assist;Max assist         General transfer comment: initially [place] even pullling up from sturdy surface of locked recliner, increased to [employer] as fatigue increased  Ambulation/Gait             General Gait Details: attempted sidesteps along EOB, patient repeatedly declined due to  fatigue  Stairs            Wheelchair Mobility    Modified Rankin (Stroke Patients Only)       Balance Overall balance assessment: Needs assistance Sitting-balance support: Feet supported;Bilateral upper extremity supported Sitting balance-Leahy Scale: Good     Standing balance support: Bilateral upper extremity supported;During functional activity Standing balance-Leahy Scale: Poor Standing balance comment: heavy reliance on B UE support                             Pertinent Vitals/Pain Pain Assessment: No/denies pain    Home Living Family/patient expects to be discharged to:: Private residence Living Arrangements: Children(lives with daughter) Available Help at Discharge: Family;Available 24 hours/day Type of Home: House Home Access: Level entry     Home Layout: One level Home Equipment: Walker - 2 wheels      Prior Function Level of Independence: Needs assistance   Gait / Transfers Assistance Needed: unsure- patient unable to give clear history  ADL's / Homemaking Assistance Needed: daughter assists        Hand Dominance        Extremity/Trunk Assessment   Upper Extremity Assessment Upper Extremity Assessment: Generalized weakness    Lower Extremity Assessment Lower Extremity Assessment: Generalized weakness    Cervical / Trunk Assessment Cervical / Trunk Assessment: Kyphotic  Communication   Communication: No difficulties  Cognition Arousal/Alertness: Awake/alert Behavior During Therapy: WFL for tasks assessed/performed Overall Cognitive Status: History of cognitive impairments - at  baseline                                 General Comments: dementia at baseline      General Comments      Exercises     Assessment/Plan    PT Assessment Patient needs continued PT services  PT Problem List Decreased strength;Decreased cognition;Decreased knowledge of use of DME;Decreased activity tolerance;Decreased safety  awareness;Decreased balance;Decreased mobility;Decreased coordination       PT Treatment Interventions DME instruction;Balance training;Gait training;Neuromuscular re-education;Stair training;Functional mobility training;Patient/family education;Therapeutic activities;Therapeutic exercise;Manual techniques    PT Goals (Current goals can be found in the Care Plan section)  Acute Rehab PT Goals Patient Stated Goal: go home PT Goal Formulation: With patient Time For Goal Achievement: 09/20/18 Potential to Achieve Goals: Fair    Frequency Min 2X/week   Barriers to discharge Decreased caregiver support      Co-evaluation               AM-PAC PT "6 Clicks" Mobility  Outcome Measure Help needed turning from your back to your side while in a flat bed without using bedrails?: A Little Help needed moving from lying on your back to sitting on the side of a flat bed without using bedrails?: A Little Help needed moving to and from a bed to a chair (including a wheelchair)?: A Lot Help needed standing up from a chair using your arms (e.g., wheelchair or bedside chair)?: A Lot Help needed to walk in hospital room?: A Lot Help needed climbing 3-5 steps with a railing? : Total 6 Click Score: 13    End of Session Equipment Utilized During Treatment: Gait belt Activity Tolerance: Patient tolerated treatment well;Patient limited by fatigue Patient left: in bed;with call bell/phone within reach;with bed alarm set Nurse Communication: Mobility status PT Visit Diagnosis: Unsteadiness on feet (R26.81);Difficulty in walking, not elsewhere classified (R26.2);Muscle weakness (generalized) (M62.81);History of falling (Z91.81)    Time: 6286-3817 PT Time Calculation (min) (ACUTE ONLY): 38 min   Charges:   PT Evaluation $PT Eval Low Complexity: 1 Low PT Treatments $Therapeutic Activity: 23-37 mins        Deniece Ree PT, DPT, CBIS  Supplemental Physical Therapist [place]    Pager  [phone] Acute Rehab Office [phone]

## 2018-09-20 NOTE — Progress Notes (Addendum)
2 Days Post-Op  Subjective: CC: Doing well this morning.  [birth year].  She is oriented to person, place, time and situation (improved from yesterday).  She denies any abdominal pain since last time I saw her yesterday.  She is tolerating a soft diet without any abdominal pain.  She denies associated fever, chills, nausea, vomiting.  Objective: Vital signs in last 24 hours: Temp:  [97.7 F (36.5 C)-99 F (37.2 C)] 97.7 F (36.5 C) (09/01 0552) Pulse Rate:  [83-92] 84 (09/01 0552) Resp:  [16-18] 16 (09/01 0552) BP: (138-154)/(52-78) 142/52 (09/01 0552) SpO2:  [95 %-96 %] 95 % (09/01 0552) Weight:  [68.7 kg] 68.7 kg (09/01 0600) Last BM Date: 09/17/18  Intake/Output from previous day: 08/31 0701 - 09/01 0700 In: 1018.3 [P.O.:240; I.V.:778.3] Out: 1000 [Urine:1000] Intake/Output this shift: No intake/output data recorded.  PE: Gen:  Alert, NAD, pleasant Pulm:  CTA b/l, rate and effort normal Abd: Soft, NT/ND, +BS Ext:  No LE edema Psych: Orientated to place, time, self and situation  Skin: no rashes noted, warm and dry  Lab Results:  Recent Labs    09/19/18 0752 09/20/18 0523  WBC 8.8 7.9  HGB 10.2* 9.5*  HCT 31.1* 30.2*  PLT 277 278   BMET Recent Labs    09/19/18 1845 09/20/18 0523  NA 141 139  K 3.0* 2.9*  CL 105 106  CO2 25 26  GLUCOSE 101* 101*  BUN 17 15  CREATININE 1.18* 1.02*  CALCIUM 8.2* 8.1*   PT/INR No results for input(s): LABPROT, INR in the last 72 hours. CMP     Component Value Date/Time   NA 139 09/20/2018 0523   K 2.9 (L) 09/20/2018 0523   CL 106 09/20/2018 0523   CO2 26 09/20/2018 0523   GLUCOSE 101 (H) 09/20/2018 0523   BUN 15 09/20/2018 0523   CREATININE 1.02 (H) 09/20/2018 0523   CALCIUM 8.1 (L) 09/20/2018 0523   PROT 5.7 (L) 09/20/2018 0523   ALBUMIN 2.5 (L) 09/20/2018 0523   AST 17 09/20/2018 0523   ALT 28 09/20/2018 0523   ALKPHOS 114 09/20/2018 0523   BILITOT 1.3 (H) 09/20/2018 0523   GFRNONAA 49 (L)  09/20/2018 0523   GFRAA 56 (L) 09/20/2018 0523   Lipase     Component Value Date/Time   LIPASE 27 09/15/2018 1254       Studies/Results: No results found.  Anti-infectives: Anti-infectives (From admission, onward)   Start     Dose/Rate Route Frequency Ordered Stop   09/19/18 1000  amoxicillin-clavulanate (AUGMENTIN) 500-125 MG per tablet 500 mg     1 tablet Oral 2 times daily 09/19/18 0933     09/16/18 0400  piperacillin-tazobactam (ZOSYN) IVPB 3.375 g  Status:  Discontinued     3.375 g 12.5 mL/hr over 240 Minutes Intravenous Every 8 hours 09/15/18 2027 09/19/18 0933   09/15/18 1545  piperacillin-tazobactam (ZOSYN) IVPB 3.375 g     3.375 g 100 mL/hr over 30 Minutes Intravenous  Once 09/15/18 1542 09/15/18 1954       Assessment/Plan HTN CKD - Cr 1.02 Chronic neuropathy pain History of left hip surgery with left leg injury in 2009 Early dementia - appears improved this morning GI Bleed - EGD on 8/30 showed no ulcers, just mild gastritis. Hgb 9.5 this AM  New onset a fib - Per medicine.   Cholecystitis, cholelithiasis Elevated LFTs - Negative MRCP 8/29 - On IV Zosyn - Tolerating a soft diet  - WBC  normalized. AST/ALT & alk phos normalized. T bili downtrending appropriately at 1.3 today.  - She appears to have improved on abx. Will plan for her to follow up with Dr. Marlou Starks to discuss interval cholecystectomy in 4-6 weeks.  - Okay for discharge from a surgical standpoint. Will need a total of 14 days of abx   FEN - Soft diet, K 2.9 (being replaced) VTE - SCDs, okay for chemical prophylaxis from a surgical standpoint ID - Zosyn 8/27 - 8/31. Augmentin 8/31 >>  Follow-up Information    Jovita Kussmaul, MD. Go on 10/06/2018.   Specialty: General Surgery Why: 9/17 arrive at 3:10. Please arrive 30 minutes prior to your appointment for paperwork. Please bring a copy of your photo ID and insurance card.  Contact information: [address] [place] [address]  [phone]           LOS: 5 days    Jillyn Ledger , [facility] 09/20/2018, 8:27 AM Pager: [phone]

## 2018-09-20 NOTE — Progress Notes (Signed)
[facility] Progress Note  Patient: [name] [id number]   PCP: Earney Mallet, MD DOB: 08/21/1927   DOA: 09/15/2018   DOS: 09/20/2018   Date of Service: the patient was seen and examined on 09/20/2018  Brief hospital course: Pt. with PMH of HTN, CKD, prior GI bleed; admitted on 09/15/2018, presented with complaint of coffee colored emesis and fever, was found to have acute cholecystitis as well as possible upper GI bleed and acute kidney injury. Currently further plan is continue IV antibiotics and further work-up depending on GI and general surgery recommendation.  Subjective: No nausea no vomiting.  No further agitation.  Assessment and Plan: 1. Acute upper GI bleed GI consulted. H&H relatively stable for now. S/p EGD 09/18/2018 no active bleeding, ulcer. Gastritis. GI signed off. Currently on Protonix.  2.  Acute cholecystitis. General surgery consulted. Elevated LFTs. CT scan shows no evidence of elevated dilated CBD. Initially on IV Zosyn.  Transition to oral Augmentin MRCP was done on 08/29 and negative for any CBD stone Surgery recommends conservative management with antibiotics for 14 days. Outpatient follow-up recommended.  3.  Acute kidney injury. In the setting of dehydration from vomiting as well as active infection. Patient was given aggressive IV hydration. Avoid nephrotoxic medications. Monitor renal function. Renal function getting better. CRCL 33.   4.  Chronic neuropathy pain. Patient is on Lyrica it was on hold until 09/19/2018. Patient received 1 dose of 75-mg and becomes sleepy and lethargic. We will reduce to 50 nightly daily.  5.  Hypotension. Multifactorial. In the setting of infection as well as hemorrhage. Monitor.  Improving.  6.  Possible aspiration pneumonia. In the setting of vomiting. Patient is already on Zosyn. We will continue for now. No growth in blood cultures. No hypoxia.  7.  New onset paroxysmal A. fib. Rate  controlled. Patient fluctuates between in and out of A. fib and normal sinus rhythm. Currently will monitor. Contraindication to anticoagulation given her GI bleed.  8 Acute onset peripheral vertigo. Sundowning and delirium Acute metabolic encephalopathy Do not think that this is actually acute stroke. Patient symptomatic although no nystagmus identified on my examination. Concern that this is sundowning and delirium in a patient with chronic dementia. Stat CT head negative, no further neuro deficit.  Continue meclizine. Treated with Seroquel nightly.  Will discontinue due to sleepiness.  Diet: soft diet DVT Prophylaxis: SCD, pharmacological prophylaxis contraindicated due to Acute GI bleed  Advance goals of care discussion: Full code  Family Communication: no family was present at bedside, at the time of interview. The pt provided permission to discuss medical plan with the family. Opportunity was given to ask question and all questions were answered satisfactorily.   Disposition:  Discharge to SNF  Consultants: gastroenterology  General surgery  Procedures: EGD   Scheduled Meds: . amoxicillin-clavulanate  1 tablet Oral BID  . Chlorhexidine Gluconate Cloth  6 each Topical Daily  . mirabegron ER  50 mg Oral Daily  . pantoprazole  40 mg Oral Daily  . pregabalin  50 mg Oral BID  . sodium chloride flush  3 mL Intravenous Q12H   Continuous Infusions:  PRN Meds: acetaminophen **OR** acetaminophen, meclizine, ondansetron **OR** ondansetron (ZOFRAN) IV Antibiotics: Anti-infectives (From admission, onward)   Start     Dose/Rate Route Frequency Ordered Stop   09/19/18 1000  amoxicillin-clavulanate (AUGMENTIN) 500-125 MG per tablet 500 mg     1 tablet Oral 2 times daily 09/19/18 0933     09/16/18 0400  piperacillin-tazobactam (ZOSYN) IVPB 3.375 g  Status:  Discontinued     3.375 g 12.5 mL/hr over 240 Minutes Intravenous Every 8 hours 09/15/18 2027 09/19/18 0933   09/15/18 1545   piperacillin-tazobactam (ZOSYN) IVPB 3.375 g     3.375 g 100 mL/hr over 30 Minutes Intravenous  Once 09/15/18 1542 09/15/18 1954       Objective: Physical Exam: Vitals:   09/19/18 2221 09/20/18 0552 09/20/18 0600 09/20/18 1349  BP: (!) 154/78 (!) 142/52  (!) 123/57  Pulse: 92 84  82  Resp: 18 16  18   Temp: 98.5 F (36.9 C) 97.7 F (36.5 C)  98.7 F (37.1 C)  TempSrc: Oral Oral  Oral  SpO2: 95% 95%  96%  Weight:   68.7 kg   Height:        Intake/Output Summary (Last 24 hours) at 09/20/2018 1930 Last data filed at 09/20/2018 0900 Gross per 24 hour  Intake 240 ml  Output 600 ml  Net -360 ml   Filed Weights   09/18/18 0624 09/19/18 0420 09/20/18 0600  Weight: 68.2 kg 68.2 kg 68.7 kg   General: alert and oriented to time, place, and person. Appear in mild distress, affect appropriate Eyes: PERRL, Conjunctiva normal ENT: Oral Mucosa Clear, moist  Neck: no JVD, no Abnormal Mass Or lumps Cardiovascular: S1 and S2 Present, no Murmur, peripheral pulses symmetrical Respiratory: normal respiratory effort, Bilateral Air entry equal and Decreased, no use of accessory muscle, Clear to Auscultation, no Crackles, no wheezes Abdomen: Bowel Sound present, Soft and mild tenderness, no hernia Skin: no rashes  Extremities: no Pedal edema, no calf tenderness Neurologic: normal without focal findings, mental status, speech normal, alert and oriented x3, PERLA, Motor strength 5/5 and symmetric and sensation grossly normal to light touch Gait not checked due to patient safety concerns  Data Reviewed: CBC: Recent Labs  Lab 09/15/18 1254 09/16/18 0154 09/17/18 0355 09/18/18 0449 09/19/18 0752 09/20/18 0523  WBC 18.2* 10.4 10.8* 9.1 8.8 7.9  NEUTROABS 16.2*  --  9.0* 6.6  --   --   HGB 11.6* 9.5* 9.5* 10.1* 10.2* 9.5*  HCT 35.1* 28.0* 29.7* 31.8* 31.1* 30.2*  MCV 93.9 92.4 95.8 96.4 95.1 96.8  PLT 286 175 236 279 277 0000000   Basic Metabolic Panel: Recent Labs  Lab 09/16/18 0154  09/17/18 0355 09/18/18 0449 09/19/18 0752 09/19/18 1845 09/20/18 0523  NA 144 141 140 140 141 139  K 3.2* 3.8 3.2* 3.4* 3.0* 2.9*  CL 116* 111 106 104 105 106  CO2 18* 20* 23 24 25 26   GLUCOSE 112* 90 88 110* 101* 101*  BUN 35* 22 18 15 17 15   CREATININE 1.51* 1.16* 1.20* 1.17* 1.18* 1.02*  CALCIUM 8.0* 8.4* 8.4* 8.3* 8.2* 8.1*  MG 1.8  --   --   --   --   --     Liver Function Tests: Recent Labs  Lab 09/16/18 0154 09/17/18 0355 09/18/18 0449 09/19/18 0752 09/20/18 0523  AST 103* 47* 28 23 17   ALT 101* 70* 52* 37 28  ALKPHOS 198* 181* 159* 136* 114  BILITOT 5.0* 2.7* 2.4* 1.8* 1.3*  PROT 5.6* 5.8* 6.2* 5.8* 5.7*  ALBUMIN 2.6* 2.6* 2.8* 2.6* 2.5*   Recent Labs  Lab 09/15/18 1254  LIPASE 27   No results for input(s): AMMONIA in the last 168 hours. Coagulation Profile: Recent Labs  Lab 09/15/18 1254  INR 1.1   Cardiac Enzymes: No results for input(s): CKTOTAL, CKMB, CKMBINDEX, TROPONINI in  the last 168 hours. BNP (last 3 results) No results for input(s): PROBNP in the last 8760 hours. CBG: Recent Labs  Lab 09/16/18 0748  GLUCAP 99   Studies: No results found.   Time spent: 35 minutes  Author: Berle Mull, MD [facility] 09/20/2018 7:30 PM  To reach On-call, see care teams to locate the attending and reach out to them via [email or web address]. If 7PM-7AM, please contact night-coverage If you still have difficulty reaching the attending provider, please page the [facility] (Director on Call) for [facility] on amion for assistance.

## 2018-09-21 DIAGNOSIS — I10 Essential (primary) hypertension: Secondary | ICD-10-CM

## 2018-09-21 DIAGNOSIS — N179 Acute kidney failure, unspecified: Secondary | ICD-10-CM

## 2018-09-21 DIAGNOSIS — R740 Nonspecific elevation of levels of transaminase and lactic acid dehydrogenase [LDH]: Secondary | ICD-10-CM

## 2018-09-21 DIAGNOSIS — K810 Acute cholecystitis: Secondary | ICD-10-CM

## 2018-09-21 DIAGNOSIS — J690 Pneumonitis due to inhalation of food and vomit: Secondary | ICD-10-CM

## 2018-09-21 DIAGNOSIS — I480 Paroxysmal atrial fibrillation: Secondary | ICD-10-CM

## 2018-09-21 DIAGNOSIS — H81399 Other peripheral vertigo, unspecified ear: Secondary | ICD-10-CM

## 2018-09-21 LAB — CBC
HCT: 29.7 % — ABNORMAL LOW (ref 36.0–46.0)
Hemoglobin: 9.5 g/dL — ABNORMAL LOW (ref 12.0–15.0)
MCH: 30.8 pg (ref 26.0–34.0)
MCHC: 32 g/dL (ref 30.0–36.0)
MCV: 96.4 fL (ref 80.0–100.0)
Platelets: 279 10*3/uL (ref 150–400)
RBC: 3.08 MIL/uL — ABNORMAL LOW (ref 3.87–5.11)
RDW: 13.3 % (ref 11.5–15.5)
WBC: 8.7 10*3/uL (ref 4.0–10.5)
nRBC: 0 % (ref 0.0–0.2)

## 2018-09-21 LAB — COMPREHENSIVE METABOLIC PANEL
ALT: 24 U/L (ref 0–44)
AST: 15 U/L (ref 15–41)
Albumin: 2.6 g/dL — ABNORMAL LOW (ref 3.5–5.0)
Alkaline Phosphatase: 109 U/L (ref 38–126)
Anion gap: 7 (ref 5–15)
BUN: 18 mg/dL (ref 8–23)
CO2: 25 mmol/L (ref 22–32)
Calcium: 8.5 mg/dL — ABNORMAL LOW (ref 8.9–10.3)
Chloride: 108 mmol/L (ref 98–111)
Creatinine, Ser: 1.07 mg/dL — ABNORMAL HIGH (ref 0.44–1.00)
GFR calc Af Amer: 53 mL/min — ABNORMAL LOW (ref >60)
GFR calc non Af Amer: 46 mL/min — ABNORMAL LOW (ref >60)
Glucose, Bld: 126 mg/dL — ABNORMAL HIGH (ref 70–99)
Potassium: 4 mmol/L (ref 3.5–5.1)
Sodium: 140 mmol/L (ref 135–145)
Total Bilirubin: 1.1 mg/dL (ref 0.3–1.2)
Total Protein: 5.9 g/dL — ABNORMAL LOW (ref 6.5–8.1)

## 2018-09-21 MED ORDER — MECLIZINE HCL 12.5 MG PO TABS: 12.5000 mg | ORAL_TABLET | Freq: Three times a day (TID) | ORAL | 0 refills | Status: AC | PRN

## 2018-09-21 MED ORDER — PANTOPRAZOLE SODIUM 40 MG PO TBEC: 40.0000 mg | DELAYED_RELEASE_TABLET | Freq: Every day | ORAL | 1 refills | Status: AC

## 2018-09-21 MED ORDER — AMLODIPINE BESYLATE 5 MG PO TABS
2.5000 mg | ORAL_TABLET | Freq: Every day | ORAL | Status: DC
  Administered 2018-09-21: 2.5 mg via ORAL
  Filled 2018-09-21: qty 1

## 2018-09-21 MED ORDER — AMOXICILLIN-POT CLAVULANATE 500-125 MG PO TABS: 1.0000 | ORAL_TABLET | Freq: Two times a day (BID) | ORAL | 0 refills | Status: AC

## 2018-09-21 MED ORDER — PREGABALIN 50 MG PO CAPS: 50.0000 mg | ORAL_CAPSULE | Freq: Two times a day (BID) | ORAL | 1 refills | Status: AC

## 2018-09-21 NOTE — Discharge Summary (Signed)
Physician Discharge Summary  [name] [id number] DOB: 07/25/1927 DOA: 09/15/2018  PCP: Earney Mallet, MD  Admit date: 09/15/2018 Discharge date: 09/21/2018  Time spent: 55 minutes  Recommendations for Outpatient Follow-up:  1. Follow-up with Dr. Marlou Starks III,  10/06/2018. 2. Follow-up with Earney Mallet, MD in 1 to 2 weeks.  On follow-up patient will need a basic metabolic profile done to follow-up on electrolytes and renal function.  Patient also need a CBC done to follow-up on H&H.   Discharge Diagnoses:  Principal Problem:   Acute upper GI bleed Active Problems:   Acute cholecystitis   AKI (acute kidney injury) ([facility])   Benign essential HTN   Transaminitis   Aspiration pneumonia due to gastric secretions (HCC)   Paroxysmal atrial fibrillation (HCC)   Peripheral vertigo   Discharge Condition: Stable and improved  Diet recommendation: Heart healthy  Filed Weights   09/19/18 0420 09/20/18 0600 09/21/18 0657  Weight: 68.2 kg 68.7 kg 67.5 kg    History of present illness:  HPI Per Dr. Luan Moore is a 85 y.o. female with Past medical history of HTN, CKD, prior GI bleed. Patient presented with complaints of abdominal pain and nausea and vomiting.  Patient is unable to provide much history. Reported that she had an episode of coffee-ground emesis 1 week ago. Today when the patient woke up she was at her baseline. Later in the afternoon she started having coffee-ground vomiting multiple episodes. She was brought to the hospital. In the hospital the patient was found hypotensive. Patient also reported to have some black-colored stool. No NSAID.  No anticoagulation.  No antiplatelet medications. Patient had a similar presentation in the past.  At that time she was found to be having pill induced gastritis.  ED Course: Patient presented with hypertension.  Elevated LFTs.  Tachycardic.  Leukocytosis.  Positive Hemoccult. Right upper quadrant ultrasound was  concerning for cholecystitis and therefore general surgery was consulted.  Patient was referred for admission.  At her baseline ambulates with support And is dependent for most of her ADL; does not manages her medication on her own  Hospital Course:  1 acute upper GI bleed likely secondary to gastritis Patient was admitted with coffee ground emesis and fever found to have an acute cholecystitis and concern for acute GI bleed.  GI was consulted and patient was seen in consultation by Dr. Oletta Lamas.  Concerns for peptic ulcer and prior history of gastric ulcers 2 years ago upper endoscopy was recommended.  Patient subsequently underwent upper endoscopy on 09/18/2018 per Dr. Alessandra Bevels which showed mild gastritis with few small gastric polyps.  No evidence of ulcer disease or active bleeding.  Patient's diet was advanced to a soft diet.  Patient initially was on IV Protonix and subsequently changed to once a day oral Protonix per GI recommendations.  Patient's hemoglobin stabilized.  Patient had no further coffee-ground emesis.  Patient be discharged home in stable and improved condition.  Outpatient follow-up with PCP.  2.  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  GI and general surgery were consulted who recommended MRCP.  CT abdomen and pelvis which was done showed no evidence of dilated common bile duct however did show cholelithiasis with gallbladder wall thickening and pericholecystic fluid tracking throughout the gallbladder fossa and towards the tip of the liver consistent with acute cholecystitis.  Patient subsequently  underwent MRCP which showed cholelithiasis with focal gallbladder wall thickening and mild gallbladder distention, findings remain equivocal for acute cholecystitis.  No biliary duct dilatation or  choledocholithiasis.  Hepatomegaly.  Patient placed empirically on IV Zosyn and monitored.  Patient was seen in consultation by general surgery who recommended conservative management with antibiotics for total of 14 days.  Patient improved clinically and subsequently transition to oral Augmentin.  Patient had no further abdominal pain.  Patient be discharged home on 9 more days of oral Augmentin to complete a 14 days of antibiotic treatment.  Patient will follow-up with gastroenterology in the outpatient setting to discuss possible interval cholecystectomy in 4 to 6 weeks.  Patient was discharged in stable and improved condition.  3..  Acute kidney injury Felt secondary to dehydration in the setting of acute infection.  Patient hydrated with IV fluids.  Nephrotoxic medications were avoided.  Renal function improved and was at baseline by day of discharge. Outpatient follow-up with PCP.  4.  Chronic neuropathic pain Patient noted to be on Lyrica on admission which was held until 09/19/2018.  Patient was placed back on her home regimen of Lyrica 75 mg however patient became more sleepy and lethargic and subsequently dose was decreased to 50 mg twice daily.  Outpatient follow-up with PCP.  5.  Hypotension Patient noted to be hypotensive during the hospitalization felt likely secondary to acute infection as well as possible upper GI bleed.  Patient hydrated with IV fluids.  Blood pressure improved.  Hypotension had resolved by day of discharge.  6.  Possible aspiration pneumonia Noted in the setting of vomiting.  Patient noted to be on IV Zosyn empirically on admission.  Blood cultures were ordered with no growth to date.  Patient had no hypoxia.  Patient was subsequently transitioned to oral Augmentin secondary to problem #1.  Improved clinically.  Outpatient follow-up.  7.  New onset paroxysmal atrial fibrillation Remains rate controlled.  Patient noted to be fluctuating in and out of A. fib in normal  sinus rhythm likely secondary to acute infection.  Anticoagulation contraindicated due to recent GI bleed.  Outpatient follow-up with PCP.  8.  Acute onset peripheral vertigo/sundowning and delirium/acute metabolic encephalopathy Patient noted to have some symptoms although no nystagmus identified on examination.  There was also concern for sundowning and delirium in patient with underlying chronic dementia.  Head CT which was obtained was negative for any acute abnormalities.  Patient had no neurological deficits.  Patient was placed on meclizine as needed.  Patient was also initially placed on cervical which was discontinued due to increased lethargy.  Patient improved clinically.  Outpatient follow-up with PCP.  Procedures:  Right upper quadrant ultrasound 09/15/2018  CXR 09/15/2018  CT head 09/17/2018  CT abdomen and pelvis 09/15/2018  MRCP 09/17/2018  Upper endoscopy 09/18/2018 per Dr. Alessandra Bevels  Consultations:  Gastroenterology: Dr. Oletta Lamas 09/16/2018  General surgery: Dr.Toth, III 09/15/2018  Discharge Exam: Vitals:   09/20/18 2047 09/21/18 0657  BP: (!) 130/54 (!) 165/74  Pulse: 87 87  Resp: 20 16  Temp: 98.8 F (37.1 C) 98.4 F (36.9 C)  SpO2: 95% 96%    General: NAD Cardiovascular: RRR Respiratory: CTAB  Discharge Instructions   Discharge Instructions    Diet - low sodium heart healthy   Complete by: As directed    Increase activity slowly   Complete by: As directed      Allergies as of 09/21/2018      Reactions   Aspirin Other (See  Comments)   Reaction unknown   Nsaids Other (See Comments)   Bleeding, skin hemorrhages   Silver Rash      Medication List    STOP taking these medications   traMADol-acetaminophen 37.5-325 MG tablet Commonly known as: Ultracet     TAKE these medications   acetaminophen 500 MG tablet Commonly known as: TYLENOL Take 2 tablets (1,000 mg total) by mouth every 8 (eight) hours as needed for mild pain, fever or headache.    amLODipine 5 MG tablet Commonly known as: NORVASC Take 5 mg by mouth daily.   amoxicillin-clavulanate 500-125 MG tablet Commonly known as: AUGMENTIN Take 1 tablet (500 mg total) by mouth 2 (two) times daily for 9 days.   meclizine 12.5 MG tablet Commonly known as: ANTIVERT Take 1 tablet (12.5 mg total) by mouth 3 (three) times daily as needed for dizziness (vertigo).   mirabegron ER 50 MG Tb24 tablet Commonly known as: MYRBETRIQ Take 50 mg by mouth daily.   Multi-Vitamins Tabs Take 1 tablet by mouth daily.   Niferex Tabs Take 150 mg by mouth daily.   pantoprazole 40 MG tablet Commonly known as: Protonix Take 1 tablet (40 mg total) by mouth daily. What changed: when to take this   pregabalin 50 MG capsule Commonly known as: LYRICA Take 1 capsule (50 mg total) by mouth 2 (two) times daily. What changed:   medication strength  how much to take   SYSTANE OP Place 1 drop into both eyes 2 (two) times daily as needed (For dry eyes.).            Durable Medical Equipment  (From admission, onward)         Start     Ordered   09/21/18 1212  For home use only DME Shower stool  Once     09/21/18 1211   09/21/18 1210  For home use only DME Bedside commode  Once    Question:  Patient needs a bedside commode to treat with the following condition  Answer:  Fear for personal safety   09/21/18 1211         Allergies  Allergen Reactions  . Aspirin Other (See Comments)    Reaction unknown  . Nsaids Other (See Comments)    Bleeding, skin hemorrhages  . Silver Rash   Follow-up Information    Jovita Kussmaul, MD. Go on 10/06/2018.   Specialty: General Surgery Why: 9/17 arrive at 3:10. Please arrive 30 minutes prior to your appointment for paperwork. Please bring a copy of your photo ID and insurance card.  Contact information: [place] [address] [phone]        Earney Mallet, MD. Schedule an appointment as soon as possible for  a visit in 2 week(s).   Specialty: Family Medicine Why: f/ui in 1-2 weeks. Contact information: [address] [place] [address] [phone]            The results of significant diagnostics from this hospitalization (including imaging, microbiology, ancillary and laboratory) are listed below for reference.    Significant Diagnostic Studies: Ct Abdomen Pelvis Wo Contrast  Result Date: 09/15/2018 CLINICAL DATA:  Cholelithiasis, porcelain gallbladder ultrasound EXAM: CT ABDOMEN AND PELVIS WITHOUT CONTRAST TECHNIQUE: Multidetector CT imaging of the abdomen and pelvis was performed following the standard protocol without IV contrast. COMPARISON:  Same-day ultrasound the right upper quadrant FINDINGS: Lower chest: Bandlike areas of basilar atelectasis and/or scarring. Cardiomegaly. Calcification of the aortic leaflets. Coronary  calcifications. Hepatobiliary: No focal liver lesion. The gallbladder is partially decompressed around several calcified gallstones. The wall appears thickened and edematous but without mural calcification. There is pericholecystic fluid in inflammation tracking throughout the gallbladder fossa and towards the tip of the liver. No visible biliary ductal dilatation or calcified intraductal gallstones. Pancreas: Fatty replacement of the pancreas. No pancreatic ductal dilatation or surrounding inflammatory changes. Spleen: This Normal in size without focal abnormality. Adrenals/Urinary Tract: Kidneys appear somewhat atrophic and diminutive in size. Kidneys are otherwise unremarkable, without renal calculi, suspicious lesion, or hydronephrosis. Bladder is grossly unremarkable though difficult to assess fully given extensive streak artifact in the pelvis. Stomach/Bowel: Distal esophagus stomach and duodenal sweep are unremarkable. No small bowel thickening or dilatation. The appendix is not well visualized as the cecal tip is partially obscured by streak artifact. Mild thickening of  the hepatic flexure, likely reactive. Distal colon has a normal appearance. Vascular/Lymphatic: Atherosclerotic plaque within the normal caliber aorta. No suspicious or enlarged lymph nodes in the included lymphatic chains. Reproductive: Uterus is surgically absent. No visible adnexal lesions though the pelvis is largely obscured by streak artifact. Other: No abdominopelvic free air. Reactive free fluid tracking along the attic tip and towards the right paracolic gutter likely tracking from the gallbladder fossa. No bowel containing hernias. Musculoskeletal: Multilevel degenerative changes are present in the imaged portions of the spine. No acute osseous abnormality or suspicious osseous lesion. Total left hip arthroplasty and plate and screw construct of the left ilium results in extensive streak artifact which limits details of the adjacent osseous structures and soft tissues of the pelvis. IMPRESSION: 1. Cholelithiasis with gallbladder wall thickening and pericholecystic fluid tracking throughout the gallbladder fossa and towards the tip of the liver, consistent with acute cholecystitis. 2. No mural calcification of the gallbladder. 3. Mild thickening of the hepatic flexure, likely reactive. 4. Evaluation of the pelvis limited by extensive streak artifact from patient's left hip arthroplasty and iliac plate screw construct. 5. Aortic Atherosclerosis (ICD10-I70.0). Electronically Signed   By: Lovena Le M.D.   On: 09/15/2018 18:13   Ct Head Wo Contrast  Result Date: 09/17/2018 CLINICAL DATA:  85 year old female with ataxia, ?The room is spinning?Marland Kitchen EXAM: CT HEAD WITHOUT CONTRAST TECHNIQUE: Contiguous axial images were obtained from the base of the skull through the vertex without intravenous contrast. COMPARISON:  None. FINDINGS: Brain: Cerebral volume is within normal limits for age. No midline shift, mass effect, or evidence of intracranial mass lesion. No ventriculomegaly. Mild dural calcifications,  including at the right operculum. No acute intracranial hemorrhage identified. Patchy cerebral white matter hypodensity mostly in the anterior frontal lobes. No cortical encephalomalacia. No cortically based acute infarct identified. Vascular: Calcified atherosclerosis at the skull base. Right MCA calcified plaque. No suspicious intracranial vascular hyperdensity. Skull: Negative. Partially visible upper cervical spine degeneration. Sinuses/Orbits: Scattered mild paranasal sinus mucosal thickening, well pneumatized sinuses overall. Bilateral tympanic cavities and mastoids appear clear. Other: No acute orbit or scalp soft tissue findings. IMPRESSION: 1. No acute intracranial abnormality. 2. Mild to moderate for age nonspecific cerebral white matter changes, most commonly due to chronic small vessel disease. 3. Mild paranasal sinus disease, significance doubtful. Electronically Signed   By: Genevie Ann M.D.   On: 09/17/2018 18:28   Mr Abdomen Mrcp Wo Contrast  Result Date: 09/17/2018 CLINICAL DATA:  Cholelithiasis.  Abnormal liver function tests. EXAM: MRI ABDOMEN WITHOUT CONTRAST  (INCLUDING MRCP) TECHNIQUE: Multiplanar multisequence MR imaging of the abdomen was performed. Heavily T2-weighted images of the  biliary and pancreatic ducts were obtained, and three-dimensional MRCP images were rendered by post processing. COMPARISON:  09/15/2018 CT and ultrasound FINDINGS: Exam is severely degraded, nearly nondiagnostic. Limitations include motion artifact. Patient was unable to follow directions and requested to be taken out of the scanner. Lower chest: Mild cardiomegaly. Bibasilar airspace disease which is suboptimally evaluated but may be progressive on the left. Example image 3/7. Hepatobiliary: Hepatomegaly at 19 cm craniocaudal. No dominant liver lesion. No intrahepatic biliary duct dilatation. The gallbladder is mildly distended. Stones within including at up to 9 mm. The fundal wall is thickened at 6 mm on 23/7.  No convincing evidence of pericholecystic edema. The common duct is normal in caliber, best evaluated on 21/2. No choledocholithiasis. Pancreas:  Normal, without mass or ductal dilatation. Spleen:  Normal in size, without focal abnormality. Adrenals/Urinary Tract: Normal adrenal glands. Normal kidneys for age, with expected cortical thinning but no hydronephrosis. Stomach/Bowel: Periampullary duodenal diverticulum. Otherwise grossly normal stomach and abdominal bowel loops. Vascular/Lymphatic: Normal caliber of the aorta and branch vessels. Prominent porta hepatis nodes including at 8 mm on 14/7. Likely reactive. Other: No ascites. Diffuse anasarca. Favor artifactual T2 hypointensity within the bladder on 20/2. No stone on recent CT. Musculoskeletal: Lumbosacral spondylosis with mild S-shaped spine curvature. IMPRESSION: 1. Severely degraded exam, as detailed above. 2. Cholelithiasis with focal gallbladder wall thickening and mild gallbladder distension. Findings remain equivocal for acute cholecystitis. 3. No biliary duct dilatation or choledocholithiasis. 4. Hepatomegaly. 5. Bibasilar airspace disease, which may be progressive, especially on the left. Correlate with symptoms to suggest ongoing infection or aspiration. Electronically Signed   By: Abigail Miyamoto M.D.   On: 09/17/2018 11:49   Dg Chest Port 1 View  Result Date: 09/15/2018 CLINICAL DATA:  Fever and vomiting today. EXAM: PORTABLE CHEST 1 VIEW COMPARISON:  None. FINDINGS: Fullness of the SUPERIOR mediastinum is noted, question goiter. The remainder of the cardiomediastinal silhouette is unremarkable. There is no evidence of focal airspace disease, pulmonary edema, suspicious pulmonary nodule/mass, pleural effusion, or pneumothorax. No acute bony abnormalities are identified. IMPRESSION: 1. No evidence of acute cardiopulmonary disease. 2. Fullness of the SUPERIOR mediastinum which may represent enlarged thyroid gland. Consider ultrasound for further  evaluation. Electronically Signed   By: Margarette Canada M.D.   On: 09/15/2018 17:31   US Abdomen Limited Ruq  Result Date: 09/15/2018 CLINICAL DATA:  85 year old female with elevated LFTs. EXAM: ULTRASOUND ABDOMEN LIMITED RIGHT UPPER QUADRANT COMPARISON:  None. FINDINGS: Gallbladder: Multiple mobile gallstones are identified, the largest measuring 1.1 cm. Gallbladder wall thickening is present. There is no evidence of pericholecystic fluid or sonographic Murphy sign. Apparent gallbladder wall calcifications noted. Common bile duct: Diameter: 3.3 mm. No evidence of intrahepatic or extrahepatic biliary dilatation. Liver: Increased hepatic echogenicity noted. No focal hepatic abnormalities are noted. Portal vein is patent on color Doppler imaging with normal direction of blood flow towards the liver. Other: None. IMPRESSION: 1. Cholelithiasis with gallbladder wall thickening but without pericholecystic fluid or sonographic Murphy sign - equivocal for acute cholecystitis. 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 2. No evidence of biliary dilatation. 3. Hepatic steatosis Electronically Signed   By: Margarette Canada M.D.   On: 09/15/2018 15:40    Microbiology: Recent Results (from the past 240 hour(s))  Blood culture (routine x 2)     Status: None   Collection Time: 09/15/18  5:21 PM   Specimen: BLOOD  Result Value Ref Range Status   Specimen Description  Final    BLOOD RIGHT ANTECUBITAL Performed at [facility] [address], [place], [place] [address]    Special Requests   Final    BOTTLES DRAWN AEROBIC AND ANAEROBIC Blood Culture results may not be optimal due to an inadequate volume of blood received in culture bottles Performed at [facility] [address], [place], [place] [address]    Culture   Final    NO GROWTH 5 DAYS Performed at [facility], [place] [address], [place],  [place] [address]    Report Status 09/20/2018 FINAL  Final  Blood culture (routine x 2)     Status: None   Collection Time: 09/15/18  5:21 PM   Specimen: BLOOD  Result Value Ref Range Status   Specimen Description   Final    BLOOD LEFT ANTECUBITAL Performed at [facility] [address], [place], [place] [address]    Special Requests   Final    BOTTLES DRAWN AEROBIC AND ANAEROBIC Blood Culture results may not be optimal due to an inadequate volume of blood received in culture bottles Performed at [facility] [address], [place], [place] [address]    Culture   Final    NO GROWTH 5 DAYS Performed at [facility], [place] [address], [place],  [address]    Report Status 09/20/2018 FINAL  Final  SARS Coronavirus 2 [facility] order, Performed in [facility] hospital lab) Nasopharyngeal Nasopharyngeal Swab     Status: None   Collection Time: 09/15/18  6:50 PM   Specimen: Nasopharyngeal Swab  Result Value Ref Range Status   SARS Coronavirus 2 NEGATIVE NEGATIVE Final    Comment: (NOTE) If result is NEGATIVE SARS-CoV-2 target nucleic acids are NOT DETECTED. The SARS-CoV-2 RNA is generally detectable in upper and lower  respiratory specimens during the acute phase of infection. The lowest  concentration of SARS-CoV-2 viral copies this assay can detect is 250  copies / mL. A negative result does not preclude SARS-CoV-2 infection  and should not be used as the sole basis for treatment or other  patient management decisions.  A negative result may occur with  improper specimen collection / handling, submission of specimen other  than nasopharyngeal swab, presence of viral mutation(s) within the  areas targeted by this assay, and inadequate number of viral copies  (<250 copies / mL). A negative result must be combined with clinical  observations, patient history, and epidemiological information. If result is POSITIVE SARS-CoV-2 target nucleic  acids are DETECTED. The SARS-CoV-2 RNA is generally detectable in upper and lower  respiratory specimens dur ing the acute phase of infection.  Positive  results are indicative of active infection with SARS-CoV-2.  Clinical  correlation with patient history and other diagnostic information is  necessary to determine patient infection status.  Positive results do  not rule out bacterial infection or co-infection with other viruses. If result is PRESUMPTIVE POSTIVE SARS-CoV-2 nucleic acids MAY BE PRESENT.   A presumptive positive result was obtained on the submitted specimen  and confirmed on repeat testing.  While 2019 novel coronavirus  (SARS-CoV-2) nucleic acids may be present in the submitted sample  additional confirmatory testing may be necessary for epidemiological  and / or clinical management purposes  to differentiate between  SARS-CoV-2 and other Sarbecovirus currently known to infect humans.  If clinically indicated additional testing with an alternate test  methodology [phone]) is advised. The SARS-CoV-2 RNA is generally  detectable in upper and lower respiratory sp ecimens during the acute  phase of infection. The expected result is Negative. Fact Sheet for Patients:  StrictlyIdeas.no Fact Sheet for Healthcare Providers: BankingDealers.co.za This test is not yet approved or cleared by the [place] FDA and has been authorized for detection and/or diagnosis of SARS-CoV-2 by FDA under an Emergency Use Authorization (EUA).  This EUA will remain in effect (meaning this test can be used) for the duration of the COVID-19 declaration under Section 564(b)(1) of the Act, 21 U.S.C. section 360bbb-3(b)(1), unless the authorization is terminated or revoked sooner. Performed at [facility], [place] [address], [place], [place] [address]   MRSA PCR Screening     Status: None   Collection Time: 09/15/18  9:43 PM    Specimen: Nasal Mucosa; Nasopharyngeal  Result Value Ref Range Status   MRSA by PCR NEGATIVE NEGATIVE Final    Comment:        The GeneXpert MRSA Assay (FDA approved for NASAL specimens only), is one component of a comprehensive MRSA colonization surveillance program. It is not intended to diagnose MRSA infection nor to guide or monitor treatment for MRSA infections. Performed at [facility], [place] [address], [place], [place] [address]      Labs: Basic Metabolic Panel: Recent Labs  Lab 09/16/18 0154  09/18/18 0449 09/19/18 0752 09/19/18 1845 09/20/18 0523 09/21/18 0425  NA 144   < > 140 140 141 139 140  K 3.2*   < > 3.2* 3.4* 3.0* 2.9* 4.0  CL 116*   < > 106 104 105 106 108  CO2 18*   < > 23 24 25 26 25   GLUCOSE 112*   < > 88 110* 101* 101* 126*  BUN 35*   < > 18 15 17 15 18   CREATININE 1.51*   < > 1.20* 1.17* 1.18* 1.02* 1.07*  CALCIUM 8.0*   < > 8.4* 8.3* 8.2* 8.1* 8.5*  MG 1.8  --   --   --   --   --   --    < > = values in this interval not displayed.   Liver Function Tests: Recent Labs  Lab 09/17/18 0355 09/18/18 0449 09/19/18 0752 09/20/18 0523 09/21/18 0425  AST 47* 28 23 17 15   ALT 70* 52* 37 28 24  ALKPHOS 181* 159* 136* 114 109  BILITOT 2.7* 2.4* 1.8* 1.3* 1.1  PROT 5.8* 6.2* 5.8* 5.7* 5.9*  ALBUMIN 2.6* 2.8* 2.6* 2.5* 2.6*   Recent Labs  Lab 09/15/18 1254  LIPASE 27   No results for input(s): AMMONIA in the last 168 hours. CBC: Recent Labs  Lab 09/15/18 1254  09/17/18 0355 09/18/18 0449 09/19/18 0752 09/20/18 0523 09/21/18 0425  WBC 18.2*   < > 10.8* 9.1 8.8 7.9 8.7  NEUTROABS 16.2*  --  9.0* 6.6  --   --   --   HGB 11.6*   < > 9.5* 10.1* 10.2* 9.5* 9.5*  HCT 35.1*   < > 29.7* 31.8* 31.1* 30.2* 29.7*  MCV 93.9   < > 95.8 96.4 95.1 96.8 96.4  PLT 286   < > 236 279 277 278 279   < > = values in this interval not displayed.   Cardiac Enzymes: No results for input(s): CKTOTAL, CKMB, CKMBINDEX, TROPONINI in the last  168 hours. BNP: BNP (last 3 results) No results for input(s): BNP in the last 8760 hours.  ProBNP (last 3 results) No results for  input(s): PROBNP in the last 8760 hours.  CBG: Recent Labs  Lab 09/16/18 0748  GLUCAP 99       Signed:  Irine Seal MD.  [facility] 09/21/2018, 3:06 PM

## 2018-09-21 NOTE — TOC Initial Note (Signed)
Transition of Care [facility]) - Initial/Assessment Note    Patient Details  Name: [name] MRN: [id number] Date of Birth: Jan 05, 1928  Transition of Care [facility]) CM/SW Contact:    Wende Neighbors, LCSW Phone Number: 09/21/2018, 9:03 AM  Clinical Narrative:       CSW spoke with patients daughter [name] in front of patients room. [name] stated she does not want patient tot go to rehab since she had her father at a rehab facility for four years. [name] stated she did not want to do that to the patient. [name] stated she wants patient to come home with [facility] and would prefer [facility]. TOC will contact [place] agency to see if can follow patient for [facility] needs. CSW will also let MD aware that family is not wanting SNF          Expected Discharge Plan: [place] Barriers to Discharge: Continued Medical Work up   Patient Goals and CMS Choice Patient states their goals for this hospitalization and ongoing recovery are:: to have patient back home CMS Medicare.gov Compare Post Acute Care list provided to:: Other (Comment Required)(patients daughter choice HH) Choice offered to / list presented to : Adult Children  Expected Discharge Plan and Services Expected Discharge Plan: [place] In-house Referral: Clinical Social Work   Post Acute Care Choice: [place] arrangements for the past 2 months: [place] Expected Discharge Date: (unknown)                                    Prior Living Arrangements/Services Living arrangements for the past 2 months: Single Family Home Lives with:: Self, Adult Children Patient language and need for interpreter reviewed:: Yes Do you feel safe going back to the place where you live?: Yes      Need for Family Participation in Patient Care: Yes (Comment) Care giver support system in place?: Yes (comment)   Criminal Activity/Legal Involvement Pertinent to Current Situation/Hospitalization: No -  Comment as needed  Activities of Daily Living Home Assistive Devices/Equipment: Eyeglasses, Environmental consultant (specify type)(front wheeled walker) ADL Screening (condition at time of admission) Patient's cognitive ability adequate to safely complete daily activities?: Yes Is the patient deaf or have difficulty hearing?: No Does the patient have difficulty seeing, even when wearing glasses/contacts?: Yes Does the patient have difficulty concentrating, remembering, or making decisions?: Yes Patient able to express need for assistance with ADLs?: Yes Does the patient have difficulty dressing or bathing?: Yes Independently performs ADLs?: No Communication: Independent Dressing (OT): Needs assistance Is this a change from baseline?: Pre-admission baseline Grooming: Needs assistance Is this a change from baseline?: Pre-admission baseline Feeding: Needs assistance Is this a change from baseline?: Pre-admission baseline Bathing: Needs assistance Is this a change from baseline?: Pre-admission baseline Toileting: Needs assistance Is this a change from baseline?: Pre-admission baseline In/Out Bed: Needs assistance Is this a change from baseline?: Pre-admission baseline Walks in Home: Needs assistance Is this a change from baseline?: Pre-admission baseline Does the patient have difficulty walking or climbing stairs?: Yes(secondary to weakness) Weakness of Legs: Both Weakness of Arms/Hands: None  Permission Sought/Granted Permission sought to share information with : Family Supports Permission granted to share information with : Yes, Verbal Permission Granted  Share Information with NAME: Cletis Ingber     Permission granted to share info w Relationship: daughter  Permission granted to share info w  Contact Information: [phone]  Emotional Assessment Appearance:: Appears stated age Attitude/Demeanor/Rapport: Unable to Assess Affect (typically observed): Unable to Assess Orientation: : Oriented  to Self, Oriented to Place, Oriented to  Time Alcohol / Substance Use: Not Applicable Psych Involvement: No (comment)  Admission diagnosis:  Cholecystitis [K81.9] Fever [R50.9] LFT elevation [R94.5] Gastrointestinal hemorrhage, unspecified gastrointestinal hemorrhage type [K92.2] Patient Active Problem List   Diagnosis Date Noted  . Acute upper GI bleed 09/15/2018  . Acute cholecystitis 09/15/2018  . Transaminitis 09/15/2018  . Fall   . Gastroesophageal reflux disease   . Benign essential HTN   . UGIB (upper gastrointestinal bleed) 02/25/2016  . AKI (acute kidney injury) ([place]) 02/25/2016  . Acute blood loss anemia 02/25/2016  . Uremia 02/25/2016   PCP:  Earney Mallet, MD Pharmacy:   [facility], [place] [address] Phone: [phone] Fax: [phone]     Social Determinants of Health (SDOH) Interventions    Readmission Risk Interventions No flowsheet data found.

## 2018-09-21 NOTE — Progress Notes (Signed)
Pt d/c to home. AVS, prescriptions, and reasons to return to ED/MD reviewed with pt and pt's daughter Caren Griffins. Daughter confirmed understanding with teachback method. PIV removed without complication. Pt escorted off of unit to car and care of daugther via main lobby.

## 2018-09-21 NOTE — TOC Progression Note (Signed)
Transition of Care [facility]) - Progression Note    Patient Details  Name: [name] MRN: [id number] Date of Birth: Jun 02, 1927  Transition of Care [facility]) CM/SW Contact  Purcell Mouton, RN Phone Number: 09/21/2018, 3:09 PM  Clinical Narrative:    Pt discharging home with daughter [name] [phone] who selected [facility] in [place], [place]. A call to [facility] revealed that start of care will not be until Tuesday next week. Daughter [name] was okay with that. 3n1 and shower seat ordered from [employer].    Expected Discharge Plan: [place] Barriers to Discharge: Continued Medical Work up  Expected Discharge Plan and Services Expected Discharge Plan: [place] In-house Referral: Clinical Social Work Discharge Planning Services: CM Consult Post Acute Care Choice: [place] arrangements for the past 2 months: Single Family Home Expected Discharge Date: 09/21/18               DME Arranged: 3-N-1 DME Agency: [employer] Date DME Agency Contacted: 09/21/18 Time DME Agency Contacted: 1207   HH Arranged: RN, PT, OT, Nurse's Aide, Social Work [employer] Agency: [place] Date [place]: 09/21/18 Time [place]: 1200 Representative spoke with at [place]: [place] ([place]) Interventions    Readmission Risk Interventions No flowsheet data found.

## 2018-09-21 NOTE — Progress Notes (Addendum)
Patient remains stable for discharge from a general surgery standpoint. We will continue to follow peripherally. She will need a total of 14 days of abx. We have arranged follow up with Dr. Marlou Starks to discuss possible interval cholecystectomy in 4-6 weeks.Please call with any questions or concerns.

## 2018-10-12 ENCOUNTER — Ambulatory Visit: Payer: Self-pay | Admitting: General Surgery

## 2018-11-17 ENCOUNTER — Other Ambulatory Visit (HOSPITAL_COMMUNITY): Payer: Medicare Other

## 2018-11-23 NOTE — Pre-Procedure Instructions (Signed)
[place], [place] [address] Phone: [phone] Fax: [phone]     Your procedure is scheduled on Monday November 9th.  Report to [facility] Main Entrance "A" at 9:45 A.M., and check in at the Admitting office.  Call this number if you have problems the morning of surgery:  [phone]  Call [phone] if you have any questions prior to your surgery date Monday-Friday 8am-4pm    Remember:  Do not eat after midnight the night before your surgery  You may drink clear liquids until 8:45 am the morning of your surgery.   Clear liquids allowed are: Water, Non-Citrus Juices (without pulp), Carbonated Beverages, Clear Tea, Black Coffee Only, and Gatorade    Take these medicines the morning of surgery with A SIP OF WATER  acetaminophen (TYLENOL)  amLODipine (NORVASC) pantoprazole (PROTONIX)  pregabalin (LYRICA)   As of today, STOP taking any Aspirin (unless otherwise instructed by your surgeon), Aleve, Naproxen, Ibuprofen, Motrin, Advil, Goody's, BC's, all herbal medications, fish oil, and all vitamins.    The Morning of Surgery  Do not wear jewelry, make-up or nail polish.  Do not wear lotions, powders, or perfumes/colognes, or deodorant  Do not shave 48 hours prior to surgery.  Men may shave face and neck.  Do not bring valuables to the hospital.  [facility] is not responsible for any belongings or valuables.  If you are a smoker, DO NOT Smoke 24 hours prior to surgery IF you wear a CPAP at night please bring your mask, tubing, and machine the morning of surgery   Remember that you must have someone to transport you home after your surgery, and remain with you for 24 hours if you are discharged the same day.   Contacts, glasses, hearing aids, dentures or bridgework may not be worn into surgery.    Leave your suitcase in the car.  After surgery it may be brought to your room.  For patients admitted  to the hospital, discharge time will be determined by your treatment team.  Patients discharged the day of surgery will not be allowed to drive home.    Special instructions:   [place]- Preparing For Surgery  Before surgery, you can play an important role. Because skin is not sterile, your skin needs to be as free of germs as possible. You can reduce the number of germs on your skin by washing with CHG (chlorahexidine gluconate) Soap before surgery.  CHG is an antiseptic cleaner which kills germs and bonds with the skin to continue killing germs even after washing.    Oral Hygiene is also important to reduce your risk of infection.  Remember - BRUSH YOUR TEETH THE MORNING OF SURGERY WITH YOUR REGULAR TOOTHPASTE  Please do not use if you have an allergy to CHG or antibacterial soaps. If your skin becomes reddened/irritated stop using the CHG.  Do not shave (including legs and underarms) for at least 48 hours prior to first CHG shower. It is OK to shave your face.  Please follow these instructions carefully.   1. Shower the NIGHT BEFORE SURGERY and the MORNING OF SURGERY with CHG Soap.   2. If you chose to wash your hair, wash your hair first as usual with your normal shampoo.  3. After you shampoo, rinse your hair and body thoroughly to remove the shampoo.  4. Use CHG as you would any other liquid soap. You can apply CHG directly  to the skin and wash gently with a scrungie or a clean washcloth.   5. Apply the CHG Soap to your body ONLY FROM THE NECK DOWN.  Do not use on open wounds or open sores. Avoid contact with your eyes, ears, mouth and genitals (private parts). Wash Face and genitals (private parts)  with your normal soap.   6. Wash thoroughly, paying special attention to the area where your surgery will be performed.  7. Thoroughly rinse your body with warm water from the neck down.  8. DO NOT shower/wash with your normal soap after using and rinsing off the CHG  Soap.  9. Pat yourself dry with a CLEAN TOWEL.  10. Wear CLEAN PAJAMAS to bed the night before surgery, wear comfortable clothes the morning of surgery  11. Place CLEAN SHEETS on your bed the night of your first shower and DO NOT SLEEP WITH PETS.    Day of Surgery:  Do not apply any deodorants/lotions. Please shower the morning of surgery with the CHG soap  Please wear clean clothes to the hospital/surgery center.   Remember to brush your teeth WITH YOUR REGULAR TOOTHPASTE.   Please read over the following fact sheets that you were given.

## 2018-11-24 ENCOUNTER — Other Ambulatory Visit (HOSPITAL_COMMUNITY)
Admission: RE | Admit: 2018-11-24 | Discharge: 2018-11-24 | Disposition: A | Discharge status: Home / Self Care | Payer: Medicare Other | Source: Ambulatory Visit | Attending: General Surgery | Admitting: General Surgery

## 2018-11-24 ENCOUNTER — Other Ambulatory Visit (HOSPITAL_COMMUNITY): Payer: Medicare Other

## 2018-11-24 ENCOUNTER — Encounter (HOSPITAL_COMMUNITY): Payer: Self-pay

## 2018-11-24 ENCOUNTER — Encounter (HOSPITAL_COMMUNITY)
Admission: RE | Admit: 2018-11-24 | Discharge: 2018-11-24 | Disposition: A | Discharge status: Home / Self Care | Payer: Medicare Other | Source: Ambulatory Visit | Attending: General Surgery | Admitting: General Surgery

## 2018-11-24 ENCOUNTER — Other Ambulatory Visit: Payer: Self-pay

## 2018-11-24 DIAGNOSIS — Z20828 Contact with and (suspected) exposure to other viral communicable diseases: Secondary | ICD-10-CM | POA: Insufficient documentation

## 2018-11-24 DIAGNOSIS — Z01812 Encounter for preprocedural laboratory examination: Secondary | ICD-10-CM | POA: Insufficient documentation

## 2018-11-24 LAB — CBC
HCT: 33.5 % — ABNORMAL LOW (ref 36.0–46.0)
Hemoglobin: 11 g/dL — ABNORMAL LOW (ref 12.0–15.0)
MCH: 31 pg (ref 26.0–34.0)
MCHC: 32.8 g/dL (ref 30.0–36.0)
MCV: 94.4 fL (ref 80.0–100.0)
Platelets: 247 10*3/uL (ref 150–400)
RBC: 3.55 MIL/uL — ABNORMAL LOW (ref 3.87–5.11)
RDW: 12.9 % (ref 11.5–15.5)
WBC: 6.1 10*3/uL (ref 4.0–10.5)
nRBC: 0 % (ref 0.0–0.2)

## 2018-11-24 LAB — COMPREHENSIVE METABOLIC PANEL
ALT: 17 U/L (ref 0–44)
AST: 19 U/L (ref 15–41)
Albumin: 3.7 g/dL (ref 3.5–5.0)
Alkaline Phosphatase: 114 U/L (ref 38–126)
Anion gap: 9 (ref 5–15)
BUN: 31 mg/dL — ABNORMAL HIGH (ref 8–23)
CO2: 22 mmol/L (ref 22–32)
Calcium: 9.3 mg/dL (ref 8.9–10.3)
Chloride: 106 mmol/L (ref 98–111)
Creatinine, Ser: 1.47 mg/dL — ABNORMAL HIGH (ref 0.44–1.00)
GFR calc Af Amer: 36 mL/min — ABNORMAL LOW (ref >60)
GFR calc non Af Amer: 31 mL/min — ABNORMAL LOW (ref >60)
Glucose, Bld: 103 mg/dL — ABNORMAL HIGH (ref 70–99)
Potassium: 4.3 mmol/L (ref 3.5–5.1)
Sodium: 137 mmol/L (ref 135–145)
Total Bilirubin: 0.7 mg/dL (ref 0.3–1.2)
Total Protein: 7.1 g/dL (ref 6.5–8.1)

## 2018-11-24 NOTE — Progress Notes (Signed)
[facility] - denies  Chest x-ray - N/A EKG - 09/17/18 Stress Test - denies ECHO - denies Cardiac Cath - denies *requested last office note and any cardiac studies from PCP, patient states she does not think she has had any cardiac studies before.  Sleep Study - denies  Aspirin Instructions: Patient instructed to hold all Aspirin, NSAID's, herbal medications, fish oil and vitamins 7 days prior to surgery.   ERAS Protcol - clear liquids until 0845 DOS PRE-SURGERY Ensure or G2- not ordered  COVID TEST- 11/24/18, pending   Anesthesia review: records requested  Patient denies shortness of breath, fever, cough and chest pain at PAT appointment   All instructions explained to the patient, with a verbal understanding of the material. Patient agrees to go over the instructions while at home for a better understanding. Patient also instructed to self quarantine after being tested for COVID-19. The opportunity to ask questions was provided.

## 2018-11-25 LAB — NOVEL CORONAVIRUS, NAA (HOSP ORDER, SEND-OUT TO REF LAB; TAT 18-24 HRS): SARS-CoV-2, NAA: NOT DETECTED

## 2018-11-25 NOTE — Progress Notes (Signed)
Anesthesia Chart Review: Recent admission 8/27-09/21/18 for acute cholecystitis. During admission she was also found to have some new paroxysmal afib. She had post discharge followup as well as preop clearance visit with her PCP Dr. Macarthur Critchley on 10/31/18. Per his note the pt was back to her baseline, repeat EKG showed NSR. He cleared her for surgery stating "Patient had a recent hospitalization with complicated cholelithiasis with cholecystitis.  She is now back to her baseline and medically cleared for cholecystectomy."  Preop labs reviewed, creatinine mildly elevated at 1.47, she has chronic renal insufficiency with baseline creatinine ~1.4 per notes.  EKG 10/31/18 (copy on pt chart): NSR. Rate 78.   [name] [facility] Stay [facility]/Anesthesiology Phone [phone] 11/25/2018 12:57 PM

## 2018-11-25 NOTE — Anesthesia Preprocedure Evaluation (Addendum)
Anesthesia Evaluation  Patient identified by MRN, date of birth, ID band Patient awake    Reviewed: Allergy & Precautions, NPO status , Patient's Chart, lab work & pertinent test results  Airway Mallampati: II  TM Distance: >3 FB Neck ROM: Full    Dental  (+) Dental Advisory Given, Teeth Intact   Pulmonary pneumonia,    Pulmonary exam normal breath sounds clear to auscultation       Cardiovascular hypertension, Pt. on medications Normal cardiovascular exam Rhythm:Regular Rate:Normal     Neuro/Psych negative neurological ROS  negative psych ROS   GI/Hepatic Neg liver ROS, GERD  ,  Endo/Other  negative endocrine ROS  Renal/GU Renal InsufficiencyRenal disease     Musculoskeletal  (+) Arthritis ,   Abdominal   Peds  Hematology negative hematology ROS (+) anemia ,   Anesthesia Other Findings   Reproductive/Obstetrics negative OB ROS                                                             Anesthesia Evaluation  Patient identified by MRN, date of birth, ID band Patient confused    Reviewed: Allergy & Precautions, NPO status , Patient's Chart, lab work & pertinent test results  Airway Mallampati: II  TM Distance: <3 FB Neck ROM: Full    Dental  (+) Edentulous Upper, Edentulous Lower   Pulmonary neg pulmonary ROS,    breath sounds clear to auscultation       Cardiovascular hypertension, Pt. on medications  Rhythm:Regular Rate:Normal     Neuro/Psych negative neurological ROS  negative psych ROS   GI/Hepatic Neg liver ROS, GERD  Medicated,  Endo/Other  negative endocrine ROS  Renal/GU Renal InsufficiencyRenal disease     Musculoskeletal  (+) Arthritis ,   Abdominal Normal abdominal exam  (+)   Peds  Hematology  (+) anemia ,   Anesthesia Other Findings   Reproductive/Obstetrics negative OB ROS                             Anesthesia Physical Anesthesia Plan  ASA: III  Anesthesia Plan: MAC   Post-op Pain Management:    Induction: Intravenous  PONV Risk Score and Plan: Propofol infusion and Ondansetron  Airway Management Planned: Natural Airway and Nasal Cannula  Additional Equipment: None  Intra-op Plan:   Post-operative Plan:   Informed Consent: I have reviewed the patients History and Physical, chart, labs and discussed the procedure including the risks, benefits and alternatives for the proposed anesthesia with the patient or authorized representative who has indicated his/her understanding and acceptance.     Consent reviewed with POA  Plan Discussed with: CRNA  Anesthesia Plan Comments: (COVID-19 Labs  No results for input(s): DDIMER, FERRITIN, LDH, CRP in the last 72 hours.  Lab Results      Component                Value               Date                      [id number]              NEGATIVE  09/15/2018            )       Anesthesia Quick Evaluation  Anesthesia Physical Anesthesia Plan  ASA: III  Anesthesia Plan: General   Post-op Pain Management:    Induction: Intravenous  PONV Risk Score and Plan: 4 or greater and Dexamethasone, Scopolamine patch - Pre-op, Ondansetron and Treatment may vary due to age or medical condition  Airway Management Planned: Oral ETT  Additional Equipment: None  Intra-op Plan:   Post-operative Plan: Extubation in OR  Informed Consent: I have reviewed the patients History and Physical, chart, labs and discussed the procedure including the risks, benefits and alternatives for the proposed anesthesia with the patient or authorized representative who has indicated his/her understanding and acceptance.     Dental advisory given  Plan Discussed with: CRNA  Anesthesia Plan Comments: (Recent admission 8/27-09/21/18 for acute cholecystitis. During admission she was also found to have some new paroxysmal afib.  She had post discharge followup as well as preop clearance visit with her PCP Dr. Macarthur Critchley on 10/31/18. Per his note the pt was back to her baseline, repeat EKG showed NSR. He cleared her for surgery stating "Patient had a recent hospitalization with complicated cholelithiasis with cholecystitis.  She is now back to her baseline and medically cleared for cholecystectomy."  Preop labs reviewed, creatinine mildly elevated at 1.47, she has chronic renal insufficiency with baseline creatinine ~1.4 per notes.  EKG 10/31/18 (copy on pt chart): NSR. Rate 78.)       Anesthesia Quick Evaluation

## 2018-11-28 ENCOUNTER — Ambulatory Visit (HOSPITAL_COMMUNITY): Payer: Medicare Other | Admitting: Physician Assistant

## 2018-11-28 ENCOUNTER — Ambulatory Visit (HOSPITAL_COMMUNITY)
Admission: RE | Admit: 2018-11-28 | Discharge: 2018-11-29 | Disposition: A | Discharge status: Home / Self Care | Payer: Medicare Other | Attending: General Surgery | Admitting: General Surgery

## 2018-11-28 ENCOUNTER — Other Ambulatory Visit: Payer: Self-pay

## 2018-11-28 ENCOUNTER — Encounter (HOSPITAL_COMMUNITY)
Admission: RE | Disposition: A | Discharge status: Home / Self Care | Payer: Self-pay | Source: Home / Self Care | Attending: General Surgery

## 2018-11-28 ENCOUNTER — Ambulatory Visit (HOSPITAL_COMMUNITY): Payer: Medicare Other | Admitting: Certified Registered"

## 2018-11-28 ENCOUNTER — Ambulatory Visit (HOSPITAL_COMMUNITY): Payer: Medicare Other

## 2018-11-28 ENCOUNTER — Encounter (HOSPITAL_COMMUNITY): Payer: Self-pay | Admitting: *Deleted

## 2018-11-28 DIAGNOSIS — Z8249 Family history of ischemic heart disease and other diseases of the circulatory system: Secondary | ICD-10-CM | POA: Insufficient documentation

## 2018-11-28 DIAGNOSIS — K219 Gastro-esophageal reflux disease without esophagitis: Secondary | ICD-10-CM | POA: Diagnosis not present

## 2018-11-28 DIAGNOSIS — K8020 Calculus of gallbladder without cholecystitis without obstruction: Secondary | ICD-10-CM | POA: Diagnosis present

## 2018-11-28 DIAGNOSIS — Z886 Allergy status to analgesic agent status: Secondary | ICD-10-CM | POA: Insufficient documentation

## 2018-11-28 DIAGNOSIS — Z79899 Other long term (current) drug therapy: Secondary | ICD-10-CM | POA: Insufficient documentation

## 2018-11-28 DIAGNOSIS — K8010 Calculus of gallbladder with chronic cholecystitis without obstruction: Secondary | ICD-10-CM | POA: Insufficient documentation

## 2018-11-28 DIAGNOSIS — M1990 Unspecified osteoarthritis, unspecified site: Secondary | ICD-10-CM | POA: Insufficient documentation

## 2018-11-28 DIAGNOSIS — I10 Essential (primary) hypertension: Secondary | ICD-10-CM | POA: Diagnosis not present

## 2018-11-28 HISTORY — PX: CHOLECYSTECTOMY: SHX55

## 2018-11-28 LAB — SURGICAL PCR SCREEN
MRSA, PCR: POSITIVE — AB
Staphylococcus aureus: POSITIVE — AB

## 2018-11-28 SURGERY — LAPAROSCOPIC CHOLECYSTECTOMY WITH INTRAOPERATIVE CHOLANGIOGRAM
Anesthesia: General | Site: Abdomen

## 2018-11-28 MED ORDER — BUPIVACAINE-EPINEPHRINE 0.5% -1:200000 IJ SOLN
INTRAMUSCULAR | Status: AC
  Filled 2018-11-28: qty 1

## 2018-11-28 MED ORDER — CHLORHEXIDINE GLUCONATE CLOTH 2 % EX PADS
6.0000 | MEDICATED_PAD | Freq: Every day | CUTANEOUS | Status: DC
  Administered 2018-11-29: 06:00:00 6 via TOPICAL

## 2018-11-28 MED ORDER — LIDOCAINE 2% (20 MG/ML) 5 ML SYRINGE
INTRAMUSCULAR | Status: DC | PRN
  Administered 2018-11-28: 100 mg via INTRAVENOUS

## 2018-11-28 MED ORDER — MUPIROCIN 2 % EX OINT
1.0000 "application " | TOPICAL_OINTMENT | Freq: Two times a day (BID) | CUTANEOUS | Status: DC
  Administered 2018-11-28 – 2018-11-29 (×2): 1 via NASAL
  Filled 2018-11-28: qty 22

## 2018-11-28 MED ORDER — SUCCINYLCHOLINE CHLORIDE 200 MG/10ML IV SOSY
PREFILLED_SYRINGE | INTRAVENOUS | Status: AC
  Filled 2018-11-28: qty 10

## 2018-11-28 MED ORDER — BUPIVACAINE-EPINEPHRINE 0.25% -1:200000 IJ SOLN
INTRAMUSCULAR | Status: DC | PRN
  Administered 2018-11-28: 15 mL

## 2018-11-28 MED ORDER — SODIUM CHLORIDE 0.9 % IR SOLN
Status: DC | PRN
  Administered 2018-11-28: 1

## 2018-11-28 MED ORDER — PANTOPRAZOLE SODIUM 40 MG PO TBEC
40.0000 mg | DELAYED_RELEASE_TABLET | Freq: Every day | ORAL | Status: DC
  Administered 2018-11-29: 10:00:00 40 mg via ORAL
  Filled 2018-11-28: qty 1

## 2018-11-28 MED ORDER — FENTANYL CITRATE (PF) 250 MCG/5ML IJ SOLN
INTRAMUSCULAR | Status: AC
  Filled 2018-11-28: qty 5

## 2018-11-28 MED ORDER — HEPARIN SODIUM (PORCINE) 5000 UNIT/ML IJ SOLN
5000.0000 [IU] | Freq: Three times a day (TID) | INTRAMUSCULAR | Status: DC
  Administered 2018-11-29: 5000 [IU] via SUBCUTANEOUS
  Filled 2018-11-28: qty 1

## 2018-11-28 MED ORDER — ONDANSETRON HCL 4 MG/2ML IJ SOLN: 4.0000 mg | Freq: Four times a day (QID) | INTRAMUSCULAR | Status: DC | PRN

## 2018-11-28 MED ORDER — ACETAMINOPHEN 500 MG PO TABS
ORAL_TABLET | ORAL | Status: AC
  Administered 2018-11-28: 10:00:00 1000 mg via ORAL
  Filled 2018-11-28: qty 2

## 2018-11-28 MED ORDER — MIRABEGRON ER 25 MG PO TB24
50.0000 mg | ORAL_TABLET | Freq: Every day | ORAL | Status: DC
  Administered 2018-11-28: 16:00:00 50 mg via ORAL
  Filled 2018-11-28: qty 2

## 2018-11-28 MED ORDER — LACTATED RINGERS IV SOLN
INTRAVENOUS | Status: DC
  Administered 2018-11-28: 10:00:00 via INTRAVENOUS

## 2018-11-28 MED ORDER — PROPOFOL 10 MG/ML IV BOLUS
INTRAVENOUS | Status: DC | PRN
  Administered 2018-11-28: 110 mg via INTRAVENOUS

## 2018-11-28 MED ORDER — AMLODIPINE BESYLATE 5 MG PO TABS
5.0000 mg | ORAL_TABLET | Freq: Every day | ORAL | Status: DC
  Administered 2018-11-29: 10:00:00 5 mg via ORAL
  Filled 2018-11-28: qty 1

## 2018-11-28 MED ORDER — MEPERIDINE HCL 25 MG/ML IJ SOLN: 6.2500 mg | INTRAMUSCULAR | Status: DC | PRN

## 2018-11-28 MED ORDER — CHLORHEXIDINE GLUCONATE CLOTH 2 % EX PADS: 6.0000 | MEDICATED_PAD | Freq: Once | CUTANEOUS | Status: DC

## 2018-11-28 MED ORDER — PHENYLEPHRINE 40 MCG/ML (10ML) SYRINGE FOR IV PUSH (FOR BLOOD PRESSURE SUPPORT)
PREFILLED_SYRINGE | INTRAVENOUS | Status: DC | PRN
  Administered 2018-11-28: 80 ug via INTRAVENOUS

## 2018-11-28 MED ORDER — ROCURONIUM BROMIDE 10 MG/ML (PF) SYRINGE
PREFILLED_SYRINGE | INTRAVENOUS | Status: AC
  Filled 2018-11-28: qty 20

## 2018-11-28 MED ORDER — PREGABALIN 75 MG PO CAPS
75.0000 mg | ORAL_CAPSULE | Freq: Every day | ORAL | Status: DC
  Administered 2018-11-29: 10:00:00 75 mg via ORAL
  Filled 2018-11-28: qty 1

## 2018-11-28 MED ORDER — CEFAZOLIN SODIUM-DEXTROSE 2-4 GM/100ML-% IV SOLN: 2.0000 g | INTRAVENOUS | Status: DC

## 2018-11-28 MED ORDER — FENTANYL CITRATE (PF) 100 MCG/2ML IJ SOLN
INTRAMUSCULAR | Status: DC | PRN
  Administered 2018-11-28: 100 ug via INTRAVENOUS
  Administered 2018-11-28: 50 ug via INTRAVENOUS

## 2018-11-28 MED ORDER — 0.9 % SODIUM CHLORIDE (POUR BTL) OPTIME
TOPICAL | Status: DC | PRN
  Administered 2018-11-28: 1000 mL

## 2018-11-28 MED ORDER — BUPIVACAINE HCL (PF) 0.25 % IJ SOLN
INTRAMUSCULAR | Status: AC
  Filled 2018-11-28: qty 10

## 2018-11-28 MED ORDER — GABAPENTIN 300 MG PO CAPS
ORAL_CAPSULE | ORAL | Status: AC
  Filled 2018-11-28: qty 1

## 2018-11-28 MED ORDER — GABAPENTIN 300 MG PO CAPS: 300.0000 mg | ORAL_CAPSULE | ORAL | Status: DC

## 2018-11-28 MED ORDER — ONDANSETRON HCL 4 MG/2ML IJ SOLN: 4.0000 mg | Freq: Once | INTRAMUSCULAR | Status: DC | PRN

## 2018-11-28 MED ORDER — FENTANYL CITRATE (PF) 100 MCG/2ML IJ SOLN: 25.0000 ug | INTRAMUSCULAR | Status: DC | PRN

## 2018-11-28 MED ORDER — PROPOFOL 10 MG/ML IV BOLUS
INTRAVENOUS | Status: AC
  Filled 2018-11-28: qty 20

## 2018-11-28 MED ORDER — ACETAMINOPHEN 500 MG PO TABS
1000.0000 mg | ORAL_TABLET | ORAL | Status: AC
  Administered 2018-11-28: 10:00:00 1000 mg via ORAL

## 2018-11-28 MED ORDER — ONDANSETRON 4 MG PO TBDP: 4.0000 mg | ORAL_TABLET | Freq: Four times a day (QID) | ORAL | Status: DC | PRN

## 2018-11-28 MED ORDER — ROCURONIUM BROMIDE 10 MG/ML (PF) SYRINGE
PREFILLED_SYRINGE | INTRAVENOUS | Status: DC | PRN
  Administered 2018-11-28: 50 mg via INTRAVENOUS

## 2018-11-28 MED ORDER — HYDROCODONE-ACETAMINOPHEN 5-325 MG PO TABS: 1.0000 | ORAL_TABLET | ORAL | Status: DC | PRN

## 2018-11-28 MED ORDER — CEFAZOLIN SODIUM-DEXTROSE 2-4 GM/100ML-% IV SOLN
INTRAVENOUS | Status: AC
  Filled 2018-11-28: qty 100

## 2018-11-28 MED ORDER — PHENYLEPHRINE HCL-NACL 10-0.9 MG/250ML-% IV SOLN
INTRAVENOUS | Status: DC | PRN
  Administered 2018-11-28: 30 ug/min via INTRAVENOUS

## 2018-11-28 MED ORDER — ONDANSETRON HCL 4 MG/2ML IJ SOLN
INTRAMUSCULAR | Status: AC
  Filled 2018-11-28: qty 2

## 2018-11-28 MED ORDER — ONDANSETRON HCL 4 MG/2ML IJ SOLN
INTRAMUSCULAR | Status: DC | PRN
  Administered 2018-11-28: 4 mg via INTRAVENOUS

## 2018-11-28 MED ORDER — DEXAMETHASONE SODIUM PHOSPHATE 10 MG/ML IJ SOLN
INTRAMUSCULAR | Status: DC | PRN
  Administered 2018-11-28: 5 mg via INTRAVENOUS

## 2018-11-28 MED ORDER — SODIUM CHLORIDE 0.9 % IV SOLN
INTRAVENOUS | Status: DC | PRN
  Administered 2018-11-28: 11 mL

## 2018-11-28 MED ORDER — MORPHINE SULFATE (PF) 2 MG/ML IV SOLN: 1.0000 mg | INTRAVENOUS | Status: DC | PRN

## 2018-11-28 MED ORDER — DEXAMETHASONE SODIUM PHOSPHATE 10 MG/ML IJ SOLN
INTRAMUSCULAR | Status: AC
  Filled 2018-11-28: qty 1

## 2018-11-28 SURGICAL SUPPLY — 35 items
APPLIER CLIP 5 13 M/L LIGAMAX5 (MISCELLANEOUS) ×3
BLADE CLIPPER SURG (BLADE) IMPLANT
CANISTER SUCT 3000ML PPV (MISCELLANEOUS) ×3 IMPLANT
CATH REDDICK CHOLANGI 4FR 50CM (CATHETERS) ×3 IMPLANT
CHLORAPREP W/TINT 26 (MISCELLANEOUS) ×3 IMPLANT
CLIP APPLIE 5 13 M/L LIGAMAX5 (MISCELLANEOUS) ×1 IMPLANT
COVER MAYO STAND STRL (DRAPES) ×3 IMPLANT
COVER SURGICAL LIGHT HANDLE (MISCELLANEOUS) ×3 IMPLANT
COVER WAND RF STERILE (DRAPES) ×3 IMPLANT
DERMABOND ADVANCED (GAUZE/BANDAGES/DRESSINGS) ×2
DERMABOND ADVANCED .7 DNX12 (GAUZE/BANDAGES/DRESSINGS) ×1 IMPLANT
DRAPE C-ARM 42X120 X-RAY (DRAPES) ×3 IMPLANT
ELECT REM PT RETURN 9FT ADLT (ELECTROSURGICAL) ×3
ELECTRODE REM PT RTRN 9FT ADLT (ELECTROSURGICAL) ×1 IMPLANT
GLOVE BIO SURGEON STRL SZ7.5 (GLOVE) ×3 IMPLANT
GOWN STRL REUS W/ TWL LRG LVL3 (GOWN DISPOSABLE) ×3 IMPLANT
GOWN STRL REUS W/TWL LRG LVL3 (GOWN DISPOSABLE) ×6
IV CATH 14GX2 1/4 (CATHETERS) ×3 IMPLANT
KIT BASIN OR (CUSTOM PROCEDURE TRAY) ×3 IMPLANT
KIT TURNOVER KIT B (KITS) ×3 IMPLANT
NS IRRIG 1000ML POUR BTL (IV SOLUTION) ×3 IMPLANT
PAD ARMBOARD 7.5X6 YLW CONV (MISCELLANEOUS) ×3 IMPLANT
POUCH SPECIMEN RETRIEVAL 10MM (ENDOMECHANICALS) ×3 IMPLANT
SCISSORS LAP 5X35 DISP (ENDOMECHANICALS) ×3 IMPLANT
SET IRRIG TUBING LAPAROSCOPIC (IRRIGATION / IRRIGATOR) ×3 IMPLANT
SET TUBE SMOKE EVAC HIGH FLOW (TUBING) ×3 IMPLANT
SLEEVE ENDOPATH XCEL 5M (ENDOMECHANICALS) ×6 IMPLANT
SPECIMEN JAR SMALL (MISCELLANEOUS) ×3 IMPLANT
SUT MNCRL AB 4-0 PS2 18 (SUTURE) ×3 IMPLANT
TOWEL GREEN STERILE (TOWEL DISPOSABLE) ×3 IMPLANT
TOWEL GREEN STERILE FF (TOWEL DISPOSABLE) ×3 IMPLANT
TRAY LAPAROSCOPIC MC (CUSTOM PROCEDURE TRAY) ×3 IMPLANT
TROCAR XCEL BLUNT TIP 100MML (ENDOMECHANICALS) ×3 IMPLANT
TROCAR XCEL NON-BLD 5MMX100MML (ENDOMECHANICALS) ×3 IMPLANT
WATER STERILE IRR 1000ML POUR (IV SOLUTION) ×3 IMPLANT

## 2018-11-28 NOTE — Transfer of Care (Signed)
Immediate Anesthesia Transfer of Care Note  Patient: [name]  Procedure(s) Performed: LAPAROSCOPIC CHOLECYSTECTOMY WITH INTRAOPERATIVE CHOLANGIOGRAM (N/A Abdomen)  Patient Location: PACU  Anesthesia Type:General  Level of Consciousness: awake, alert  and oriented  Airway & Oxygen Therapy: Patient Spontanous Breathing and Patient connected to nasal cannula oxygen  Post-op Assessment: Report given to RN and Post -op Vital signs reviewed and stable  Post vital signs: Reviewed and stable  Last Vitals:  Vitals Value Taken Time  BP 133/52 11/28/18 1307  Temp    Pulse 70 11/28/18 1308  Resp 10 11/28/18 1308  SpO2 100 % 11/28/18 1308  Vitals shown include unvalidated device data.  Last Pain:  Vitals:   11/28/18 1000  TempSrc:   PainSc: 0-No pain      Patients Stated Pain Goal: 3 ([id number])  Complications: No apparent anesthesia complications

## 2018-11-28 NOTE — Anesthesia Procedure Notes (Signed)
Procedure Name: Intubation Date/Time: 11/28/2018 12:02 PM Performed by: Imagene Riches, CRNA Pre-anesthesia Checklist: Patient identified, Emergency Drugs available, Suction available and Patient being monitored Patient Re-evaluated:Patient Re-evaluated prior to induction Oxygen Delivery Method: Circle System Utilized Preoxygenation: Pre-oxygenation with 100% oxygen Induction Type: IV induction Ventilation: Mask ventilation without difficulty Laryngoscope Size: Miller and 2 Grade View: Grade II Tube type: Oral Tube size: 7.0 mm Number of attempts: 1 Airway Equipment and Method: Stylet and Oral airway Placement Confirmation: ETT inserted through vocal cords under direct vision,  positive ETCO2 and breath sounds checked- equal and bilateral Secured at: 22 cm Tube secured with: Tape Dental Injury: Teeth and Oropharynx as per pre-operative assessment

## 2018-11-28 NOTE — Progress Notes (Signed)
Received pt from pacu, AOX4, VSS, no complaints of pain at this time. Oriented to room, call bell, and phone placed within reach, use of bed controls. Patient voided. Will continue to monitor

## 2018-11-28 NOTE — H&P (Signed)
[name]  Location: [facility] Patient #: [id number] DOB: January 28, 1927 Widowed / Language: Cleophus Molt / Race: White Female   History of Present Illness  The patient is a 85 year old female who presents for a follow-up for Abdominal pain. The patient is a 85 year old white female who was recently hospitalized with cholecystitis with cholelithiasis. She improved with antibiotics. She has not had abdominal pain since she returned home. She has been able to eat some. She denies any fevers or chills. She denies any nausea or vomiting.   Past Surgical History  Anal Fissure Repair  Hip Surgery  Left. Tonsillectomy   Diagnostic Studies History  Colonoscopy  5-10 years ago Mammogram  >3 years ago Pap Smear  >5 years ago  Allergies  Aspirin Adult *ANALGESICS - NonNarcotic*  Allergies Reconciled   Medication History  Pantoprazole Sodium (40MG  Tablet DR, Oral) Active. Myrbetriq (50MG  Tablet ER 24HR, Oral) Active. Ciprofloxacin HCl (250MG  Tablet, Oral) Active. amLODIPine Besylate (5MG  Tablet, Oral) Active. Pregabalin (50MG  Capsule, Oral) Active. Lyrica (75MG  Capsule, Oral) Active. Medications Reconciled  Social History  Caffeine use  Tea. No drug use  Tobacco use  Never smoker.  Family History  Breast Cancer  Mother, Sister. Heart Disease  Mother.  Pregnancy / Birth History Maternal age  74-25 Para  2  Other Problems  Arthritis  Bladder Problems  Gastric Ulcer  Gastroesophageal Reflux Disease  High blood pressure     Review of Systems  General Not Present- Appetite Loss, Chills, Fatigue, Fever, Night Sweats, Weight Gain and Weight Loss. Skin Not Present- Change in Wart/Mole, Dryness, Hives, Jaundice, New Lesions, Non-Healing Wounds, Rash and Ulcer. HEENT Not Present- Earache, Hearing Loss, Hoarseness, Nose Bleed, Oral Ulcers, Ringing in the Ears, Seasonal Allergies, Sinus Pain, Sore Throat, Visual Disturbances, Wears  glasses/contact lenses and Yellow Eyes. Respiratory Not Present- Bloody sputum, Chronic Cough, Difficulty Breathing, Snoring and Wheezing. Breast Not Present- Breast Mass, Breast Pain, Nipple Discharge and Skin Changes. Cardiovascular Not Present- Chest Pain, Difficulty Breathing Lying Down, Leg Cramps, Palpitations, Rapid Heart Rate, Shortness of Breath and Swelling of Extremities. Gastrointestinal Not Present- Abdominal Pain, Bloating, Bloody Stool, Change in Bowel Habits, Chronic diarrhea, Constipation, Difficulty Swallowing, Excessive gas, Gets full quickly at meals, Hemorrhoids, Indigestion, Nausea, Rectal Pain and Vomiting. Female Genitourinary Present- Nocturia. Not Present- Frequency, Painful Urination, Pelvic Pain and Urgency. Neurological Present- Decreased Memory. Not Present- Fainting, Headaches, Numbness, Seizures, Tingling, Tremor, Trouble walking and Weakness.  Vitals  Weight: 149.8 lb Height: 65in Body Surface Area: 1.75 m Body Mass Index: 24.93 kg/m  Temp.: 36F(Temporal)  Pulse: 99 (Regular)  BP: 102/60 (Sitting, Left Arm, Standard)       Physical Exam  General Mental Status-Alert. General Appearance-Consistent with stated age. Hydration-Well hydrated. Voice-Normal.  Head and Neck Head-normocephalic, atraumatic with no lesions or palpable masses. Trachea-midline. Thyroid Gland Characteristics - normal size and consistency.  Eye Eyeball - Bilateral-Extraocular movements intact. Sclera/Conjunctiva - Bilateral-No scleral icterus.  Chest and Lung Exam Chest and lung exam reveals -quiet, even and easy respiratory effort with no use of accessory muscles and on auscultation, normal breath sounds, no adventitious sounds and normal vocal resonance. Inspection Chest Wall - Normal. Back - normal.  Cardiovascular Cardiovascular examination reveals -normal heart sounds, regular rate and rhythm with no murmurs and normal pedal pulses  bilaterally.  Abdomen Note: The abdomen is soft and nontender. There is no palpable mass.   Neurologic Neurologic evaluation reveals -alert and oriented x 3 with no impairment of recent or remote  memory. Mental Status-Normal.  Musculoskeletal Normal Exam - Left-Upper Extremity Strength Normal and Lower Extremity Strength Normal. Normal Exam - Right-Upper Extremity Strength Normal and Lower Extremity Strength Normal.  Lymphatic Head & Neck  General Head & Neck Lymphatics: Bilateral - Description - Normal. Axillary  General Axillary Region: Bilateral - Description - Normal. Tenderness - Non Tender. Femoral & Inguinal  Generalized Femoral & Inguinal Lymphatics: Bilateral - Description - Normal. Tenderness - Non Tender.    Assessment & Plan CHOLELITHIASIS WITH CHOLECYSTITIS (K80.10) Impression: The patient appears to have cholecystitis with cholelithiasis. She improved with medical management but the likelihood is that she will continue to have problems in the future because of the stones in her gallbladder. For this reason I think she would benefit from having her gallbladder removed. She would also like to have this done. I have discussed with her in detail the risks and benefits of the operation as well as some of the technical aspects including the risk of common bile duct injury and she understands and wishes to proceed. We will ask her medical doctor for clearance and then plan for surgery in the near future.

## 2018-11-28 NOTE — Interval H&P Note (Signed)
History and Physical Interval Note:  11/28/2018 11:10 AM  [name]  has presented today for surgery, with the diagnosis of gallstones.  The various methods of treatment have been discussed with the patient and family. After consideration of risks, benefits and other options for treatment, the patient has consented to  Procedure(s): LAPAROSCOPIC CHOLECYSTECTOMY WITH INTRAOPERATIVE CHOLANGIOGRAM (N/A) as a surgical intervention.  The patient's history has been reviewed, patient examined, no change in status, stable for surgery.  I have reviewed the patient's chart and labs.  Questions were answered to the patient's satisfaction.     Autumn Messing III

## 2018-11-28 NOTE — Op Note (Signed)
11/28/2018  12:57 PM  PATIENT:  [name]  85 y.o. female  PRE-OPERATIVE DIAGNOSIS:  gallstones  POST-OPERATIVE DIAGNOSIS:  gallstones  PROCEDURE:  Procedure(s): LAPAROSCOPIC CHOLECYSTECTOMY WITH INTRAOPERATIVE CHOLANGIOGRAM (N/A)  SURGEON:  Surgeon(s) and Role:    * Jovita Kussmaul, MD - Primary    * Armandina Gemma, MD - Assisting  PHYSICIAN ASSISTANT:   ASSISTANTS: Dr. Harlow Asa   ANESTHESIA:   local and general  EBL:  minimal   BLOOD ADMINISTERED:none  DRAINS: none   LOCAL MEDICATIONS USED:  MARCAINE     SPECIMEN:  Source of Specimen:  gallbladder  DISPOSITION OF SPECIMEN:  PATHOLOGY  COUNTS:  YES  TOURNIQUET:  * No tourniquets in log *  DICTATION: .Dragon Dictation     Procedure: After informed consent was obtained the patient was brought to the operating room and placed in the supine position on the operating room table. After adequate induction of general anesthesia the patient's abdomen was prepped with ChloraPrep allowed to dry and draped in usual sterile manner. An appropriate timeout was performed. The area below the umbilicus was infiltrated with quarter percent  Marcaine. A small incision was made with a 15 blade knife. The incision was carried down through the subcutaneous tissue bluntly with a hemostat and [employer] retractors. The linea alba was identified. The linea alba was incised with a 15 blade knife and each side was grasped with Coker clamps. The preperitoneal space was then probed with a hemostat until the peritoneum was opened and access was gained to the abdominal cavity. A 0 Vicryl pursestring stitch was placed in the fascia surrounding the opening. A Hassan cannula was then placed through the opening and anchored in place with the previously placed Vicryl purse string stitch. The abdomen was insufflated with carbon dioxide without difficulty. A laparoscope was inserted through the [facility] cannula and the right upper quadrant was inspected. Next the  epigastric region was infiltrated with % Marcaine. A small incision was made with a 15 blade knife. A 5 mm port was placed bluntly through this incision into the abdominal cavity under direct vision. Next 2 sites were chosen laterally on the right side of the abdomen for placement of 5 mm ports. Each of these areas was infiltrated with quarter percent Marcaine. Small stab incisions were made with a 15 blade knife. 5 mm ports were then placed bluntly through these incisions into the abdominal cavity under direct vision without difficulty. A blunt grasper was placed through the lateralmost 5 mm port and used to grasp the dome of the gallbladder and elevated anteriorly and superiorly. Another blunt grasper was placed through the other 5 mm port and used to retract the body and neck of the gallbladder. A dissector was placed through the epigastric port and using the electrocautery the peritoneal reflection at the gallbladder neck was opened. Blunt dissection was then carried out in this area until the gallbladder neck-cystic duct junction was readily identified and a good window was created. A single clip was placed on the gallbladder neck. A small  ductotomy was made just below the clip with laparoscopic scissors. A 14-gauge Angiocath was then placed through the anterior abdominal wall under direct vision. A Reddick cholangiogram catheter was then placed through the Angiocath and flushed. The catheter was then placed in the cystic duct and anchored in place with a clip. A cholangiogram was obtained that showed no filling defects good emptying into the duodenum an adequate length on the cystic duct. The anchoring clip and  catheters were then removed from the patient. 3 clips were placed proximally on the cystic duct and the duct was divided between the 2 sets of clips. Posterior to this the cystic artery was identified and again dissected bluntly in a circumferential manner until a good window  was created. 2 clips  were placed proximally and one distally on the artery and the artery was divided between the 2 sets of clips. There was a posterior and anterior branch that were controlled separately. Next a laparoscopic hook cautery device was used to separate the gallbladder from the liver bed. Prior to completely detaching the gallbladder from the liver bed the liver bed was inspected and several small bleeding points were coagulated with the electrocautery until the area was completely hemostatic. The gallbladder was then detached the rest of the way from the liver bed without difficulty. A laparoscopic bag was inserted through the hassan port. The laparoscope was moved to the epigastric port. The gallbladder was placed within the bag and the bag was sealed.  The bag with the gallbladder was then removed with the [facility] cannula through the infraumbilical port without difficulty. The fascial defect was then closed with the previously placed Vicryl pursestring stitch as well as with another figure-of-eight 0 Vicryl stitch. The liver bed was inspected again and found to be hemostatic. The abdomen was irrigated with copious amounts of saline until the effluent was clear. The ports were then removed under direct vision without difficulty and were found to be hemostatic. The gas was allowed to escape. The skin incisions were all closed with interrupted 4-0 Monocryl subcuticular stitches. Dermabond dressings were applied. The patient tolerated the procedure well. At the end of the case all needle sponge and instrument counts were correct. The patient was then awakened and taken to recovery in stable condition  PLAN OF CARE: Admit for overnight observation  PATIENT DISPOSITION:  PACU - hemodynamically stable.   Delay start of Pharmacological VTE agent (>24hrs) due to surgical blood loss or risk of bleeding: no

## 2018-11-29 ENCOUNTER — Encounter (HOSPITAL_COMMUNITY): Payer: Self-pay | Admitting: General Surgery

## 2018-11-29 DIAGNOSIS — K8010 Calculus of gallbladder with chronic cholecystitis without obstruction: Secondary | ICD-10-CM | POA: Diagnosis not present

## 2018-11-29 MED ORDER — HYDROCODONE-ACETAMINOPHEN 5-325 MG PO TABS: 1.0000 | ORAL_TABLET | Freq: Four times a day (QID) | ORAL | 0 refills | Status: AC | PRN

## 2018-11-29 NOTE — Discharge Summary (Signed)
Physician Discharge Summary  Patient ID: [name] MRN: [id number] DOB/AGE: Sep 13, 1927 85 y.o.  Admit date: 11/28/2018 Discharge date: 11/29/2018  Admission Diagnoses:  Discharge Diagnoses:  Active Problems:   Gallstones   Discharged Condition: good  Hospital Course: the pt underwent lap chole. She tolerated surgery well. On pod 1 she was ready for d/c home  Consults: None  Significant Diagnostic Studies: none  Treatments: surgery: as above  Discharge Exam: Blood pressure (!) 141/60, pulse 79, temperature 98.2 F (36.8 C), temperature source Oral, resp. rate 18, height 5\' 4"  (1.626 m), weight 66.7 kg, SpO2 99 %. General appearance: alert and cooperative Resp: clear to auscultation bilaterally Cardio: regular rate and rhythm GI: soft, minimal tenderness  Disposition: Discharge disposition: 01-Home or Self Care       Discharge Instructions    Call MD for:  difficulty breathing, headache or visual disturbances   Complete by: As directed    Call MD for:  extreme fatigue   Complete by: As directed    Call MD for:  hives   Complete by: As directed    Call MD for:  persistant dizziness or light-headedness   Complete by: As directed    Call MD for:  persistant nausea and vomiting   Complete by: As directed    Call MD for:  redness, tenderness, or signs of infection (pain, swelling, redness, odor or green/yellow discharge around incision site)   Complete by: As directed    Call MD for:  severe uncontrolled pain   Complete by: As directed    Call MD for:  temperature >100.4   Complete by: As directed    Diet - low sodium heart healthy   Complete by: As directed    Discharge instructions   Complete by: As directed    May shower. Low fat diet. No heavy lifting   Increase activity slowly   Complete by: As directed    No wound care   Complete by: As directed      Allergies as of 11/29/2018      Reactions   Aspirin Other (See Comments)   Reaction unknown   Nsaids Other (See Comments)   Bleeding, skin hemorrhages   Silver Rash      Medication List    TAKE these medications   acetaminophen 500 MG tablet Commonly known as: TYLENOL Take 2 tablets (1,000 mg total) by mouth every 8 (eight) hours as needed for mild pain, fever or headache.   amLODipine 5 MG tablet Commonly known as: NORVASC Take 5 mg by mouth daily.   clotrimazole 1 % cream Commonly known as: LOTRIMIN Apply 1 application topically 2 (two) times daily.   HYDROcodone-acetaminophen 5-325 MG tablet Commonly known as: NORCO/VICODIN Take 1-2 tablets by mouth every 6 (six) hours as needed for moderate pain or severe pain.   meclizine 12.5 MG tablet Commonly known as: ANTIVERT Take 1 tablet (12.5 mg total) by mouth 3 (three) times daily as needed for dizziness (vertigo).   mirabegron ER 50 MG Tb24 tablet Commonly known as: MYRBETRIQ Take 50 mg by mouth daily in the afternoon.   Multi-Vitamins Tabs Take 1 tablet by mouth daily.   Niferex Tabs Take 150 mg by mouth daily.   pantoprazole 40 MG tablet Commonly known as: Protonix Take 1 tablet (40 mg total) by mouth daily.   pregabalin 50 MG capsule Commonly known as: LYRICA Take 1 capsule (50 mg total) by mouth 2 (two) times daily. What changed:   how much  to take  when to take this   SYSTANE OP Place 1 drop into both eyes 2 (two) times daily as needed (For dry eyes.).      Follow-up Information    Autumn Messing III, MD Follow up in 3 week(s).   Specialty: General Surgery Contact information: [address] [phone]           Signed: Autumn Messing III 11/29/2018, 8:13 AM

## 2018-11-29 NOTE — Progress Notes (Signed)
1 Day Post-Op   Subjective/Chief Complaint: No complaints   Objective: Vital signs in last 24 hours: Temp:  [97.3 F (36.3 C)-98.2 F (36.8 C)] 98.2 F (36.8 C) (11/10 0519) Pulse Rate:  [66-89] 79 (11/10 0519) Resp:  [8-19] 18 (11/09 1948) BP: (125-151)/(47-71) 141/60 (11/10 0519) SpO2:  [98 %-100 %] 99 % (11/10 0519) Weight:  [66.7 kg] 66.7 kg (11/09 0935) Last BM Date: 11/27/18  Intake/Output from previous day: 11/09 0701 - 11/10 0700 In: 1245 [P.O.:420; I.V.:825] Out: 1915 [Urine:1900; Blood:15] Intake/Output this shift: No intake/output data recorded.  General appearance: alert and cooperative Resp: clear to auscultation bilaterally Cardio: regular rate and rhythm GI: soft, minimal tenderness. incisions look good  Lab Results:  No results for input(s): WBC, HGB, HCT, PLT in the last 72 hours. BMET No results for input(s): NA, K, CL, CO2, GLUCOSE, BUN, CREATININE, CALCIUM in the last 72 hours. PT/INR No results for input(s): LABPROT, INR in the last 72 hours. ABG No results for input(s): PHART, HCO3 in the last 72 hours.  Invalid input(s): PCO2, PO2  Studies/Results: Dg Cholangiogram Operative  Result Date: 11/28/2018 CLINICAL DATA:  Gallstone EXAM: INTRAOPERATIVE CHOLANGIOGRAM TECHNIQUE: Cholangiographic images from the C-arm fluoroscopic device were submitted for interpretation post-operatively. Please see the procedural report for the amount of contrast and the fluoroscopy time utilized. COMPARISON:  MR 09/17/2018 FINDINGS: No persistent filling defects in the common duct. Intrahepatic ducts are incompletely visualized, appearing decompressed centrally. Contrast passes into the duodenum. : Negative for retained common duct stone. Electronically Signed   By: Lucrezia Europe M.D.   On: 11/28/2018 13:06    Anti-infectives: Anti-infectives (From admission, onward)   Start     Dose/Rate Route Frequency Ordered Stop   11/28/18 0946  ceFAZolin (ANCEF) 2-4 GM/100ML-% IVPB     Note to Pharmacy: Jasmine Pang   : cabinet override      11/28/18 0946 11/28/18 1530   11/28/18 0945  ceFAZolin (ANCEF) IVPB 2g/100 mL premix  Status:  Discontinued     2 g 200 mL/hr over 30 Minutes Intravenous On call to O.R. 11/28/18 0939 11/28/18 1501      Assessment/Plan: s/p Procedure(s): LAPAROSCOPIC CHOLECYSTECTOMY WITH INTRAOPERATIVE CHOLANGIOGRAM (N/A) Advance diet Discharge  LOS: 0 days    [name] 11/29/2018

## 2018-11-29 NOTE — Anesthesia Postprocedure Evaluation (Signed)
Anesthesia Post Note  Patient: [name]  Procedure(s) Performed: LAPAROSCOPIC CHOLECYSTECTOMY WITH INTRAOPERATIVE CHOLANGIOGRAM (N/A Abdomen)     Patient location during evaluation: PACU Anesthesia Type: General Level of consciousness: sedated and patient cooperative Pain management: pain level controlled Vital Signs Assessment: post-procedure vital signs reviewed and stable Respiratory status: spontaneous breathing Cardiovascular status: stable Anesthetic complications: no    Last Vitals:  Vitals:   11/28/18 2018 11/29/18 0519  BP: (!) 125/56 (!) 141/60  Pulse: 85 79  Resp:    Temp: 36.6 C 36.8 C  SpO2: 99% 99%    Last Pain:  Vitals:   11/29/18 0519  TempSrc: Oral  PainSc:                  Nolon Nations

## 2018-11-30 LAB — SURGICAL PATHOLOGY

## 2019-03-23 ENCOUNTER — Ambulatory Visit: Payer: Medicare Other

## 2019-03-26 ENCOUNTER — Ambulatory Visit: Payer: Medicare Other | Attending: Internal Medicine

## 2019-03-26 DIAGNOSIS — Z23 Encounter for immunization: Secondary | ICD-10-CM | POA: Insufficient documentation

## 2019-03-26 NOTE — Progress Notes (Signed)
   Covid-19 Vaccination Clinic  Name:  [name]    MRN: [id number] DOB: [birth year]  03/26/2019  [name] was observed post Covid-19 immunization for 15 minutes without incident. She was provided with Vaccine Information Sheet and instruction to access the V-Safe system.   [name] was instructed to call 911 with any severe reactions post vaccine: Marland Kitchen Difficulty breathing  . Swelling of face and throat  . A fast heartbeat  . A bad rash all over body  . Dizziness and weakness   Immunizations Administered    Name Date Dose VIS Date Route   Pfizer COVID-19 Vaccine 03/26/2019 12:47 PM 0.3 mL 12/30/2018 Intramuscular   Manufacturer: [place]   Lot: [id number]   [place]: [id number]

## 2019-04-25 ENCOUNTER — Ambulatory Visit: Payer: Medicare Other | Attending: Internal Medicine

## 2019-04-25 DIAGNOSIS — Z23 Encounter for immunization: Secondary | ICD-10-CM

## 2019-04-25 NOTE — Progress Notes (Signed)
   Covid-19 Vaccination Clinic  Name:  [name]    MRN: [id number] DOB: Jan 12, 1928  04/25/2019  [name] was observed post Covid-19 immunization for 15 minutes without incident. She was provided with Vaccine Information Sheet and instruction to access the V-Safe system.   [name] was instructed to call 911 with any severe reactions post vaccine: Marland Kitchen Difficulty breathing  . Swelling of face and throat  . A fast heartbeat  . A bad rash all over body  . Dizziness and weakness   Immunizations Administered    Name Date Dose VIS Date Route   Pfizer COVID-19 Vaccine 04/25/2019 12:36 PM 0.3 mL 12/30/2018 Intramuscular   Manufacturer: [place]   Lot: [id number]   [place]: [id number]

## 2020-11-28 IMAGING — DX PORTABLE CHEST - 1 VIEW
1 series · 1 of 1 positions shown · non-contrast
Comparison: None.

CLINICAL DATA: Fever and vomiting today.

EXAM:
PORTABLE CHEST 1 VIEW

[chest ap]
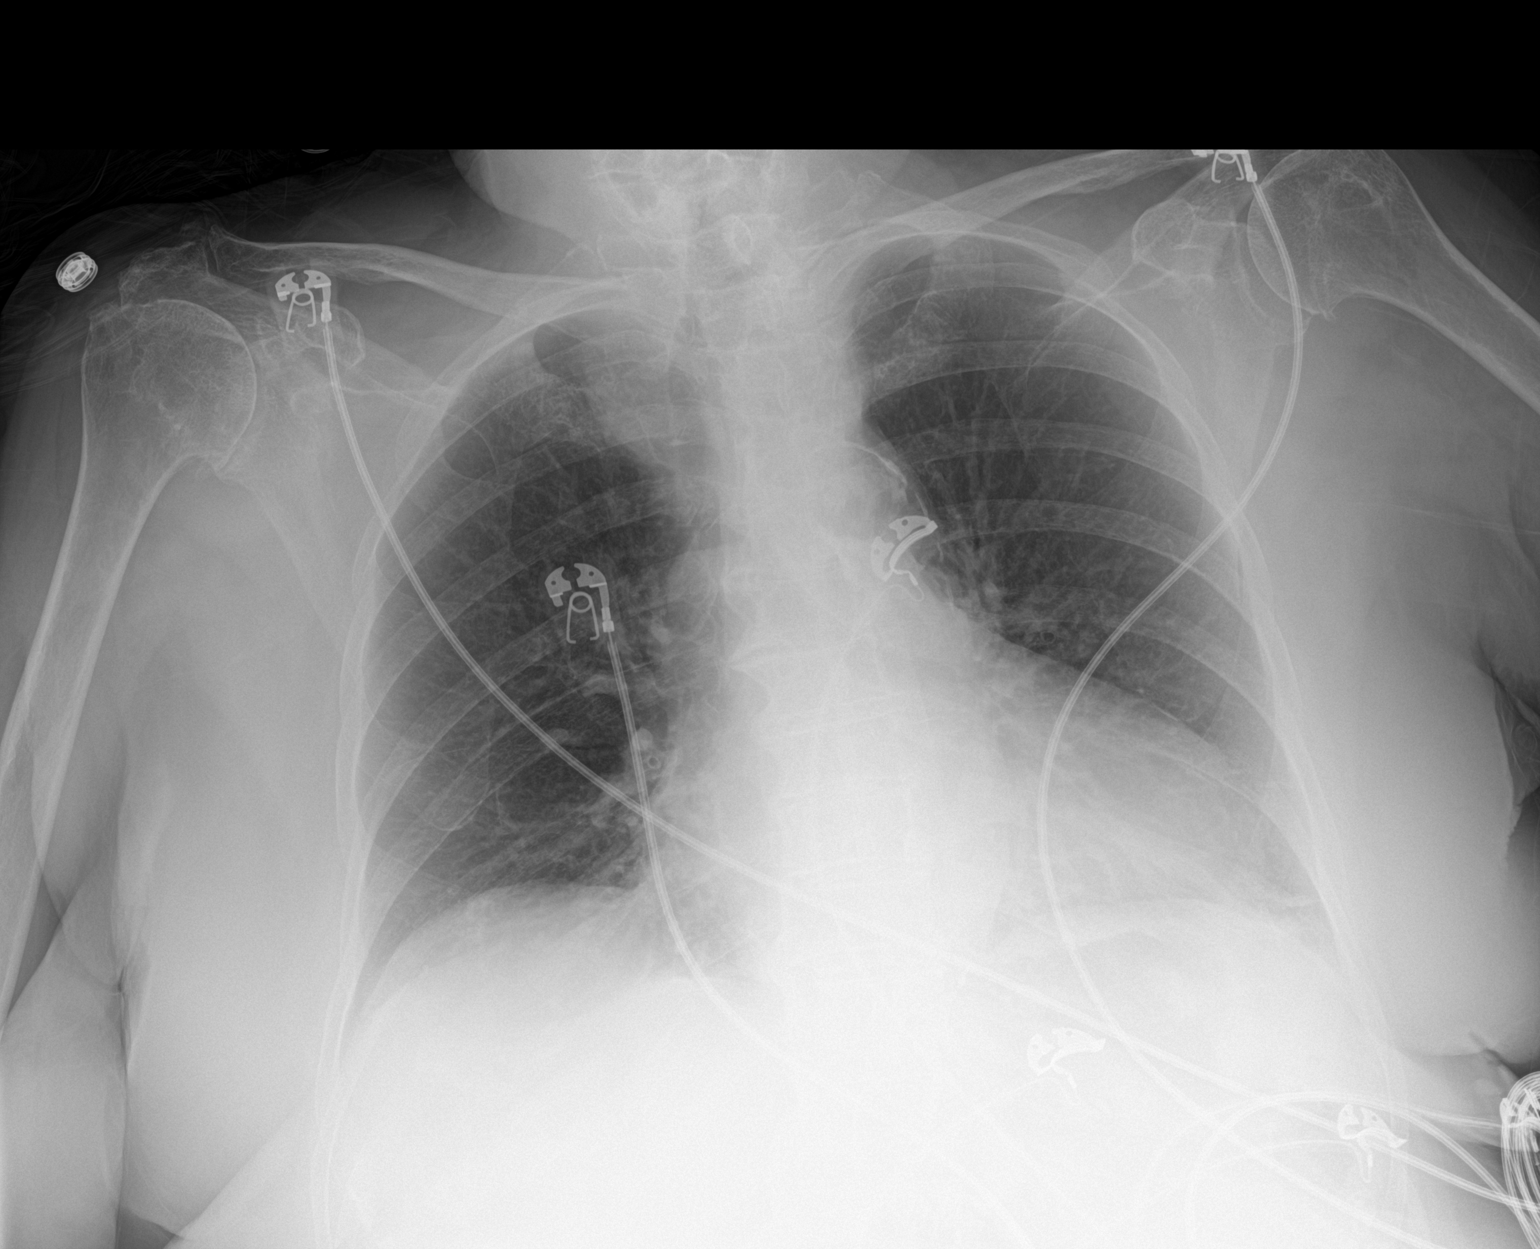

[1 of 1 positions shown; findings below may reference images not displayed]

FINDINGS: Fullness of the SUPERIOR mediastinum is noted, question goiter.

The remainder of the cardiomediastinal silhouette is unremarkable.

There is no evidence of focal airspace disease, pulmonary edema,
suspicious pulmonary nodule/mass, pleural effusion, or pneumothorax.

No acute bony abnormalities are identified.
IMPRESSION: 1. No evidence of acute cardiopulmonary disease.
2. Fullness of the SUPERIOR mediastinum which may represent enlarged
thyroid gland. Consider ultrasound for further evaluation.

## 2020-11-28 IMAGING — US ULTRASOUND ABDOMEN LIMITED
2 series · 14 of 25 positions shown · non-contrast
Comparison: None.

CLINICAL DATA: 89-year-old female with elevated LFTs.

EXAM:
ULTRASOUND ABDOMEN LIMITED RIGHT UPPER QUADRANT

[Series 1: ultrasound abdomen limited · 13 of 48 slices shown (1 of 2)]
[im 1/48]
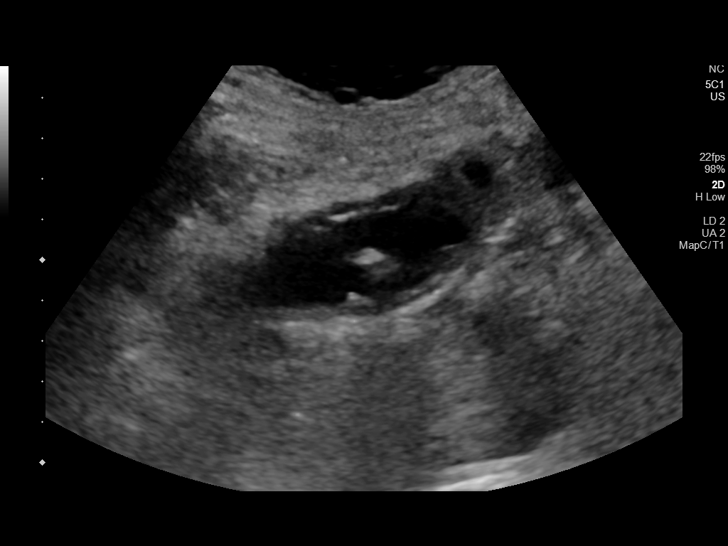
[im 5/48]
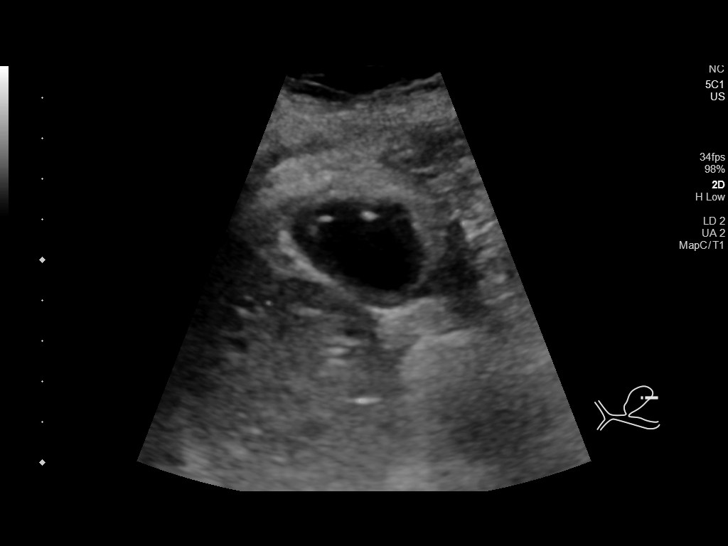
[im 9/48]
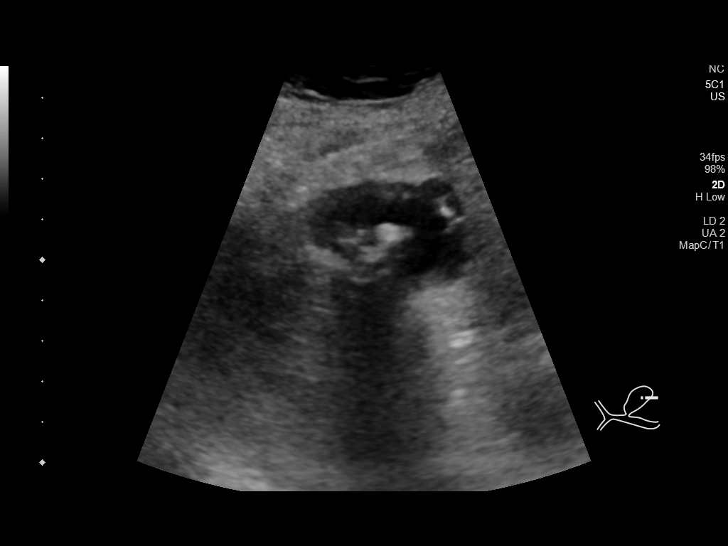
[im 13/48]
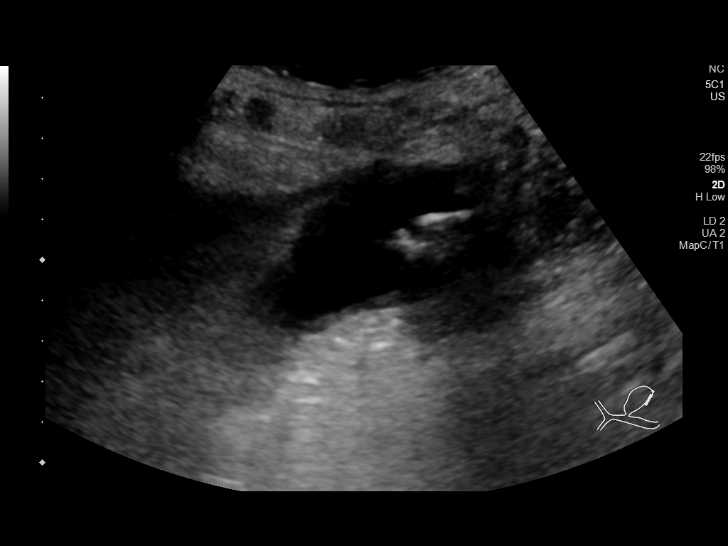
[im 17/48]
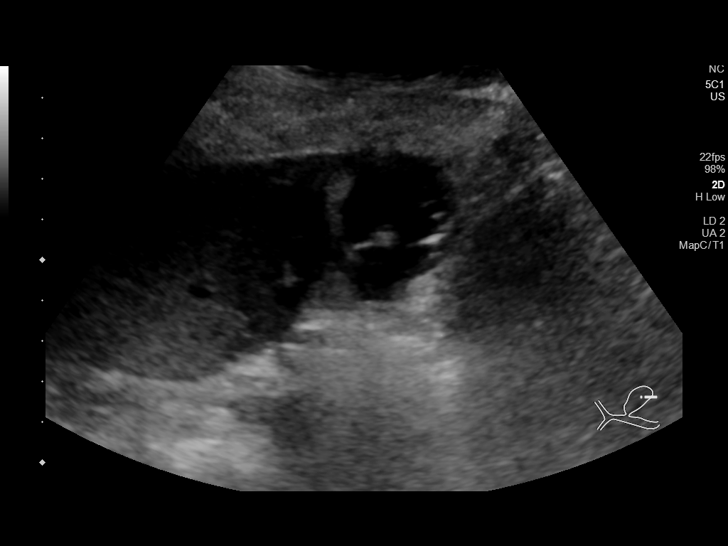
[im 19/48]
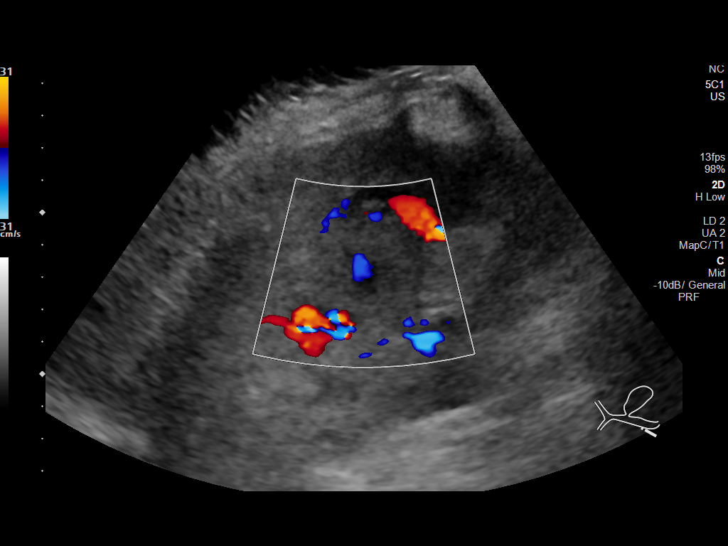
[im 23/48]
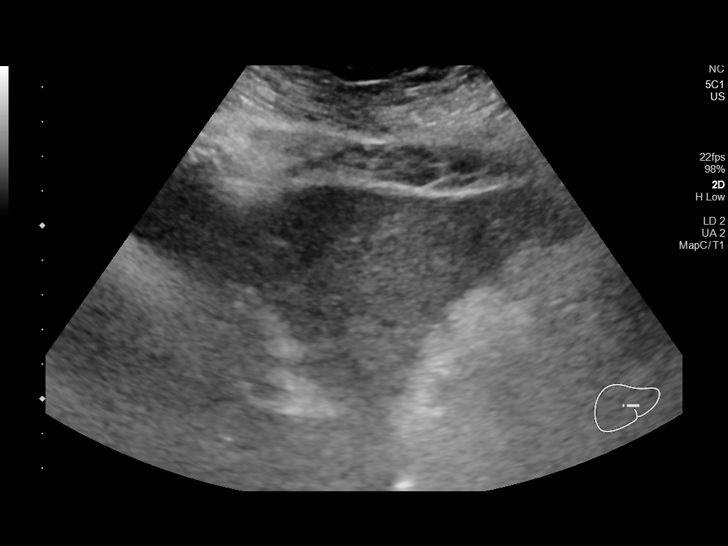
[im 27/48]
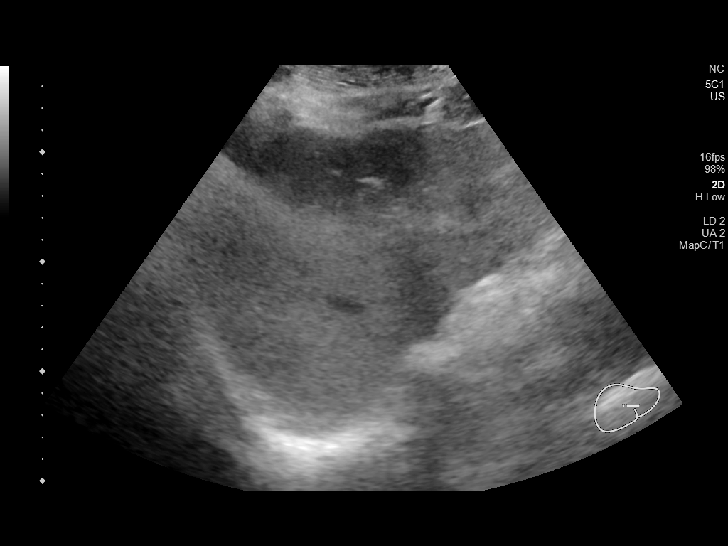
[im 31/48]
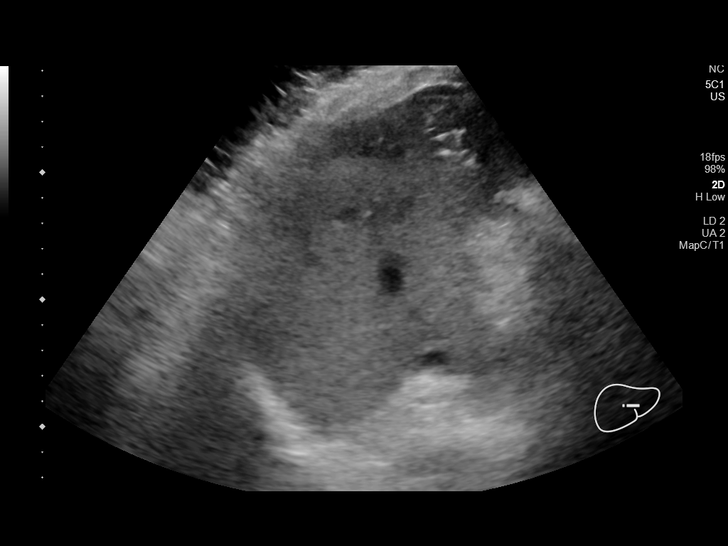
[im 33/48]
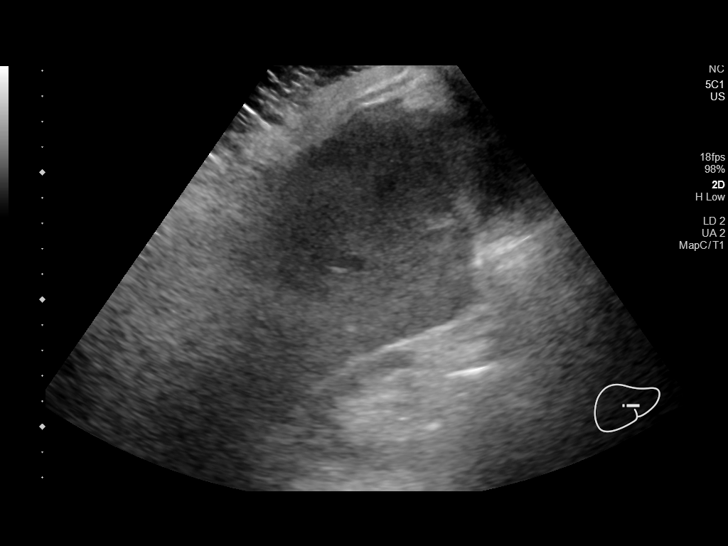
[im 37/48]
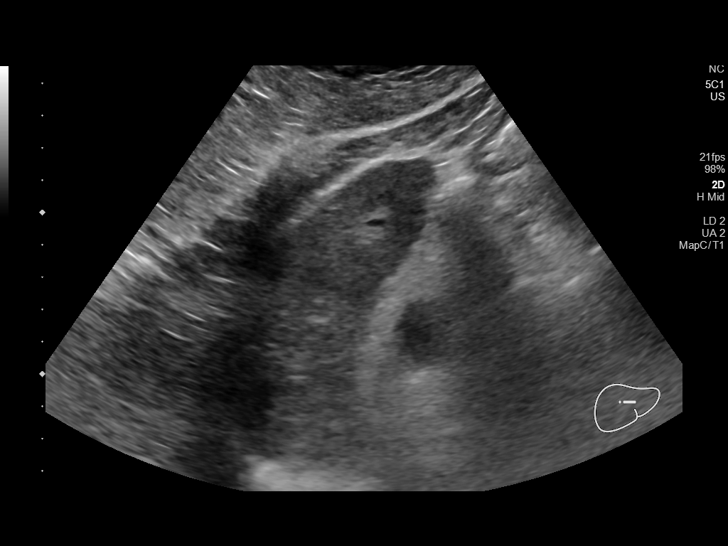
[im 41/48]
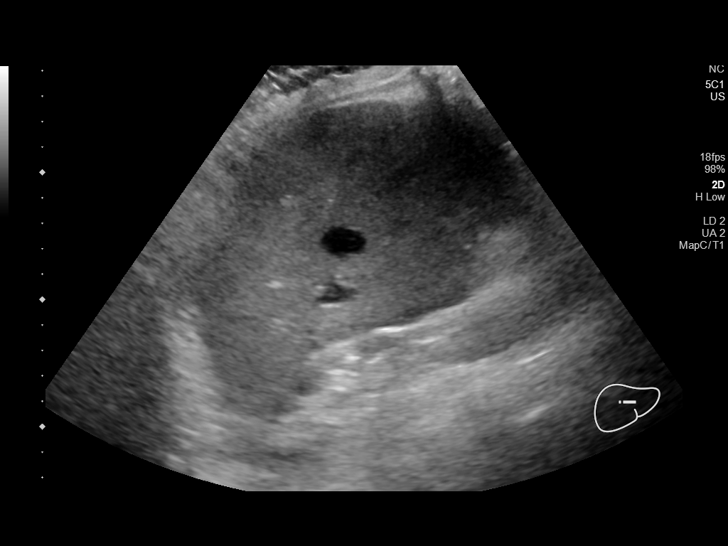
[im 45/48]
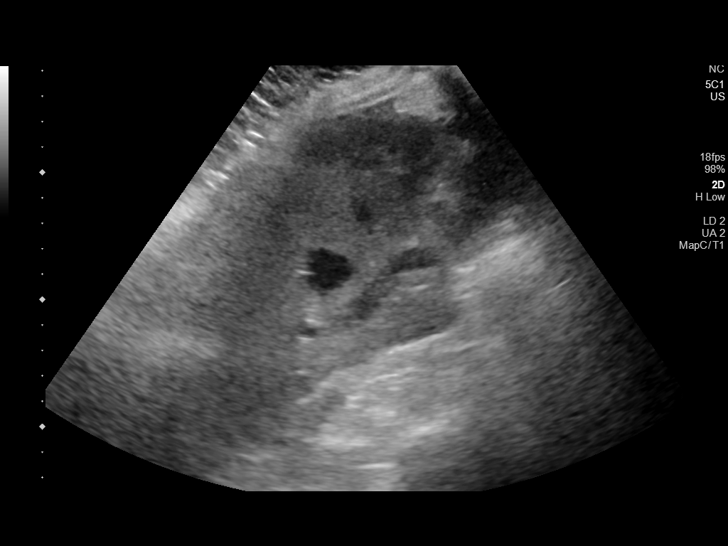

[Series 2: ultrasound abdomen limited · 1 of 1 slices shown (2 of 2)]
[im 1/1]
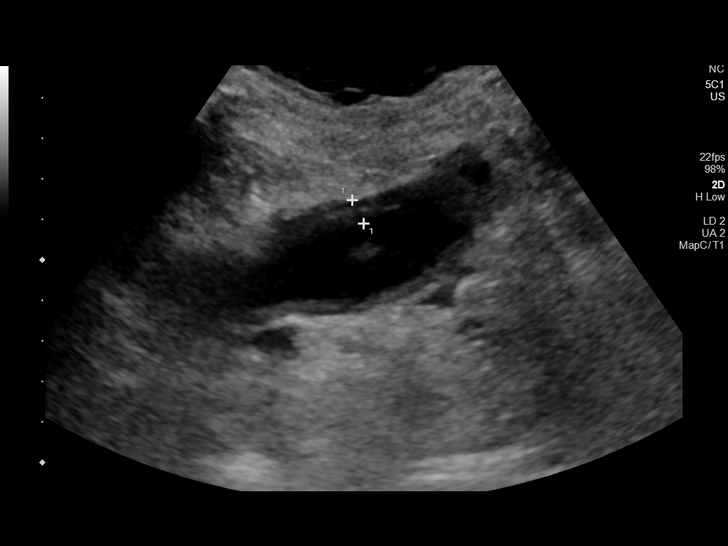

[14 of 25 positions shown; findings below may reference images not displayed]

FINDINGS: Gallbladder:

Multiple mobile gallstones are identified, the largest measuring
cm. Gallbladder wall thickening is present. There is no evidence of
pericholecystic fluid or sonographic Murphy sign. Apparent
gallbladder wall calcifications noted.

Common bile duct:

Diameter: 3.3 mm. No evidence of intrahepatic or extrahepatic
biliary dilatation.

Liver:

Increased hepatic echogenicity noted. No focal hepatic abnormalities
are noted. Portal vein is patent on color Doppler imaging with
normal direction of blood flow towards the liver.

Other: None.
IMPRESSION: 1. Cholelithiasis with gallbladder wall thickening but without
pericholecystic fluid or sonographic Murphy sign - equivocal for
acute cholecystitis. Possible gallbladder wall
calcification/porcelain gallbladder which may increased risk of
gallbladder carcinoma.
2. No evidence of biliary dilatation.
3. Hepatic steatosis

## 2021-08-19 DEATH — deceased
